# Patient Record
Sex: Male | Born: 1950
Health system: Southern US, Community
[De-identification: ages and names within clinical notes are randomized; demographics above are authoritative.]

## PROBLEM LIST (undated history)

## (undated) DIAGNOSIS — I2510 Atherosclerotic heart disease of native coronary artery without angina pectoris: Secondary | ICD-10-CM

## (undated) DIAGNOSIS — E785 Hyperlipidemia, unspecified: Secondary | ICD-10-CM

## (undated) DIAGNOSIS — I10 Essential (primary) hypertension: Secondary | ICD-10-CM

## (undated) DIAGNOSIS — I509 Heart failure, unspecified: Secondary | ICD-10-CM

## (undated) HISTORY — DX: Heart failure, unspecified: I50.9

## (undated) HISTORY — DX: Atherosclerotic heart disease of native coronary artery without angina pectoris: I25.10

## (undated) HISTORY — DX: Hyperlipidemia, unspecified: E78.5

## (undated) HISTORY — PX: OTHER SURGICAL HISTORY: SHX169

## (undated) HISTORY — DX: Essential (primary) hypertension: I10

---

## 2014-09-30 DIAGNOSIS — M25511 Pain in right shoulder: Secondary | ICD-10-CM | POA: Insufficient documentation

## 2016-01-19 ENCOUNTER — Other Ambulatory Visit (INDEPENDENT_AMBULATORY_CARE_PROVIDER_SITE_OTHER): Payer: BLUE CROSS/BLUE SHIELD

## 2016-01-19 ENCOUNTER — Ambulatory Visit (INDEPENDENT_AMBULATORY_CARE_PROVIDER_SITE_OTHER): Payer: BLUE CROSS/BLUE SHIELD | Admitting: Internal Medicine

## 2016-01-19 ENCOUNTER — Encounter: Payer: Self-pay | Admitting: Internal Medicine

## 2016-01-19 VITALS — BP 128/80 | HR 59 | Temp 98.3°F | Ht 73.0 in | Wt 218.0 lb

## 2016-01-19 DIAGNOSIS — IMO0001 Reserved for inherently not codable concepts without codable children: Secondary | ICD-10-CM

## 2016-01-19 DIAGNOSIS — I10 Essential (primary) hypertension: Secondary | ICD-10-CM

## 2016-01-19 DIAGNOSIS — B9681 Helicobacter pylori [H. pylori] as the cause of diseases classified elsewhere: Secondary | ICD-10-CM | POA: Diagnosis not present

## 2016-01-19 DIAGNOSIS — K2970 Gastritis, unspecified, without bleeding: Secondary | ICD-10-CM

## 2016-01-19 DIAGNOSIS — K219 Gastro-esophageal reflux disease without esophagitis: Secondary | ICD-10-CM

## 2016-01-19 DIAGNOSIS — Z23 Encounter for immunization: Secondary | ICD-10-CM

## 2016-01-19 DIAGNOSIS — Z0000 Encounter for general adult medical examination without abnormal findings: Secondary | ICD-10-CM | POA: Insufficient documentation

## 2016-01-19 LAB — BASIC METABOLIC PANEL
BUN: 18 mg/dL (ref 6–23)
CO2: 29 mEq/L (ref 19–32)
Calcium: 10 mg/dL (ref 8.4–10.5)
Chloride: 103 mEq/L (ref 96–112)
Creatinine, Ser: 1.04 mg/dL (ref 0.40–1.50)
GFR: 75.77 mL/min (ref >60.00)
Glucose, Bld: 102 mg/dL — ABNORMAL HIGH (ref 70–99)
Potassium: 5.2 mEq/L — ABNORMAL HIGH (ref 3.5–5.1)
Sodium: 138 mEq/L (ref 135–145)

## 2016-01-19 LAB — URINALYSIS
Bilirubin Urine: NEGATIVE
Hgb urine dipstick: NEGATIVE
Ketones, ur: NEGATIVE
Leukocytes, UA: NEGATIVE
Nitrite: NEGATIVE
Specific Gravity, Urine: 1.015 (ref 1.000–1.030)
Total Protein, Urine: NEGATIVE
Urine Glucose: NEGATIVE
Urobilinogen, UA: 0.2 (ref 0.0–1.0)
pH: 6.5 (ref 5.0–8.0)

## 2016-01-19 LAB — PSA: PSA: 2.43 ng/mL (ref 0.10–4.00)

## 2016-01-19 LAB — LIPID PANEL
Cholesterol: 242 mg/dL — ABNORMAL HIGH (ref 0–200)
HDL: 48.1 mg/dL (ref >39.00)
LDL Cholesterol: 168 mg/dL — ABNORMAL HIGH (ref 0–99)
NonHDL: 193.8
Total CHOL/HDL Ratio: 5
Triglycerides: 128 mg/dL (ref 0.0–149.0)
VLDL: 25.6 mg/dL (ref 0.0–40.0)

## 2016-01-19 LAB — CBC WITH DIFFERENTIAL/PLATELET
Basophils Absolute: 0 10*3/uL (ref 0.0–0.1)
Basophils Relative: 0.3 % (ref 0.0–3.0)
Eosinophils Absolute: 0.1 10*3/uL (ref 0.0–0.7)
Eosinophils Relative: 1.2 % (ref 0.0–5.0)
HCT: 39.9 % (ref 39.0–52.0)
Hemoglobin: 13.6 g/dL (ref 13.0–17.0)
Lymphocytes Relative: 36.7 % (ref 12.0–46.0)
Lymphs Abs: 2.1 10*3/uL (ref 0.7–4.0)
MCHC: 34.2 g/dL (ref 30.0–36.0)
MCV: 87.2 fl (ref 78.0–100.0)
Monocytes Absolute: 0.4 10*3/uL (ref 0.1–1.0)
Monocytes Relative: 6.4 % (ref 3.0–12.0)
Neutro Abs: 3.2 10*3/uL (ref 1.4–7.7)
Neutrophils Relative %: 55.4 % (ref 43.0–77.0)
Platelets: 218 10*3/uL (ref 150.0–400.0)
RBC: 4.57 Mil/uL (ref 4.22–5.81)
RDW: 13.5 % (ref 11.5–15.5)
WBC: 5.7 10*3/uL (ref 4.0–10.5)

## 2016-01-19 LAB — H. PYLORI ANTIBODY, IGG: H Pylori IgG: POSITIVE — AB

## 2016-01-19 LAB — TSH: TSH: 1.3 u[IU]/mL (ref 0.35–4.50)

## 2016-01-19 MED ORDER — VITAMIN D 1000 UNITS PO TABS: 1000.0000 [IU] | ORAL_TABLET | Freq: Every day | ORAL | 3 refills | Status: AC

## 2016-01-19 MED ORDER — LISINOPRIL 20 MG PO TABS: 20.0000 mg | ORAL_TABLET | Freq: Every day | ORAL | 3 refills | Status: DC

## 2016-01-19 MED ORDER — OMEPRAZOLE 20 MG PO CPDR: 20.0000 mg | DELAYED_RELEASE_CAPSULE | Freq: Every day | ORAL | 3 refills | Status: DC

## 2016-01-19 NOTE — Assessment & Plan Note (Signed)
Omeprazole qd Labs

## 2016-01-19 NOTE — Progress Notes (Signed)
Pre visit review using our clinic review tool, if applicable. No additional management support is needed unless otherwise documented below in the visit note. 

## 2016-01-19 NOTE — Assessment & Plan Note (Signed)
We discussed age appropriate health related issues, including available/recomended screening tests and vaccinations. We discussed a need for adhering to healthy diet and exercise. Labs/EKG were reviewed/ordered. All questions were answered.   

## 2016-01-19 NOTE — Assessment & Plan Note (Signed)
Lisinopril 

## 2016-01-20 LAB — HEPATITIS C ANTIBODY: HCV Ab: NEGATIVE

## 2016-01-23 ENCOUNTER — Other Ambulatory Visit: Payer: Self-pay | Admitting: Internal Medicine

## 2016-01-23 MED ORDER — AMOXICILL-CLARITHRO-LANSOPRAZ PO MISC: Freq: Two times a day (BID) | ORAL | 0 refills | Status: DC

## 2016-01-25 DIAGNOSIS — K2970 Gastritis, unspecified, without bleeding: Secondary | ICD-10-CM

## 2016-01-25 DIAGNOSIS — B9681 Helicobacter pylori [H. pylori] as the cause of diseases classified elsewhere: Secondary | ICD-10-CM | POA: Insufficient documentation

## 2016-01-25 NOTE — Assessment & Plan Note (Signed)
Prevpac

## 2016-01-25 NOTE — Progress Notes (Signed)
Subjective:  Patient ID: Alexander Hughes, male    DOB: [birth year]  Age: 65 y.o. MRN: [id number]  CC: Establish Care   HPI Alexander Hughes presents w/a complaint of GERD sx's - chronic and HTN - chronic  No outpatient prescriptions prior to visit.   No facility-administered medications prior to visit.     ROS Review of Systems  Constitutional: Negative for appetite change, fatigue and unexpected weight change.  HENT: Negative for congestion, nosebleeds, sneezing, sore throat and trouble swallowing.   Eyes: Negative for itching and visual disturbance.  Respiratory: Negative for cough.   Cardiovascular: Negative for chest pain, palpitations and leg swelling.  Gastrointestinal: Negative for abdominal distention, blood in stool, diarrhea and nausea.  Genitourinary: Negative for frequency and hematuria.  Musculoskeletal: Negative for back pain, gait problem, joint swelling and neck pain.  Skin: Negative for rash.  Neurological: Negative for dizziness, tremors, speech difficulty and weakness.  Psychiatric/Behavioral: Negative for agitation, dysphoric mood and sleep disturbance. The patient is not nervous/anxious.   GERD  Objective:  BP 128/80   Pulse (!) 59   Temp 98.3 F (36.8 C) (Oral)   Ht 6\' 1"  (1.854 m)   Wt 218 lb (98.9 kg)   SpO2 97%   BMI 28.76 kg/m   BP Readings from Last 3 Encounters:  01/19/16 128/80    Wt Readings from Last 3 Encounters:  01/19/16 218 lb (98.9 kg)    Physical Exam  Constitutional: He is oriented to person, place, and time. He appears well-developed. No distress.  NAD  HENT:  Mouth/Throat: Oropharynx is clear and moist.  Eyes: Conjunctivae are normal. Pupils are equal, round, and reactive to light.  Neck: Normal range of motion. No JVD present. No thyromegaly present.  Cardiovascular: Normal rate, regular rhythm, normal heart sounds and intact distal pulses.  Exam reveals no gallop and no friction rub.   No murmur  heard. Pulmonary/Chest: Effort normal and breath sounds normal. No respiratory distress. He has no wheezes. He has no rales. He exhibits no tenderness.  Abdominal: Soft. Bowel sounds are normal. He exhibits no distension and no mass. There is no tenderness. There is no rebound and no guarding.  Musculoskeletal: Normal range of motion. He exhibits no edema or tenderness.  Lymphadenopathy:    He has no cervical adenopathy.  Neurological: He is alert and oriented to person, place, and time. He has normal reflexes. No cranial nerve deficit. He exhibits normal muscle tone. He displays a negative Romberg sign. Coordination and gait normal.  Skin: Skin is warm and dry. No rash noted.  Psychiatric: He has a normal mood and affect. His behavior is normal. Judgment and thought content normal.    Lab Results  Component Value Date   WBC 5.7 01/19/2016   HGB 13.6 01/19/2016   HCT 39.9 01/19/2016   PLT 218.0 01/19/2016   GLUCOSE 102 (H) 01/19/2016   CHOL 242 (H) 01/19/2016   TRIG 128.0 01/19/2016   HDL 48.10 01/19/2016   LDLCALC 168 (H) 01/19/2016   NA 138 01/19/2016   K 5.2 (H) 01/19/2016   CL 103 01/19/2016   CREATININE 1.04 01/19/2016   BUN 18 01/19/2016   CO2 29 01/19/2016   TSH 1.30 01/19/2016   PSA 2.43 01/19/2016    Patient was never admitted.  Assessment & Plan:   Alexander Hughes was seen today for establish care.  Diagnoses and all orders for this visit:  Well adult -     Basic metabolic panel; Future -  CBC with Differential/Platelet; Future -     Hepatitis C antibody; Future -     Lipid panel; Future -     PSA; Future -     TSH; Future -     Urinalysis; Future -     H. pylori antibody, IgG; Future  Gastroesophageal reflux disease, esophagitis presence not specified -     H. pylori antibody, IgG; Future  Essential hypertension  Need for prophylactic vaccination against Streptococcus pneumoniae (pneumococcus) -     Pneumococcal polysaccharide vaccine 23-valent greater  than or equal to 2yo subcutaneous/IM  Other orders -     lisinopril (PRINIVIL,ZESTRIL) 20 MG tablet; Take 1 tablet (20 mg total) by mouth daily. -     omeprazole (PRILOSEC) 20 MG capsule; Take 1 capsule (20 mg total) by mouth daily. -     cholecalciferol (VITAMIN D) 1000 units tablet; Take 1 tablet (1,000 Units total) by mouth daily.   I am having Alexander Hughes start on lisinopril, omeprazole, and cholecalciferol.  Meds ordered this encounter  Medications  . lisinopril (PRINIVIL,ZESTRIL) 20 MG tablet    Sig: Take 1 tablet (20 mg total) by mouth daily.    Dispense:  90 tablet    Refill:  3  . omeprazole (PRILOSEC) 20 MG capsule    Sig: Take 1 capsule (20 mg total) by mouth daily.    Dispense:  30 capsule    Refill:  3  . cholecalciferol (VITAMIN D) 1000 units tablet    Sig: Take 1 tablet (1,000 Units total) by mouth daily.    Dispense:  100 tablet    Refill:  3     Follow-up: Return in about 6 months (around 07/21/2016) for a follow-up visit.  Sonda PrimesAlex Plotnikov, MD

## 2016-03-15 ENCOUNTER — Encounter: Payer: Self-pay | Admitting: Internal Medicine

## 2016-03-15 ENCOUNTER — Ambulatory Visit (INDEPENDENT_AMBULATORY_CARE_PROVIDER_SITE_OTHER): Payer: BLUE CROSS/BLUE SHIELD | Admitting: Internal Medicine

## 2016-03-15 DIAGNOSIS — B9681 Helicobacter pylori [H. pylori] as the cause of diseases classified elsewhere: Secondary | ICD-10-CM | POA: Diagnosis not present

## 2016-03-15 DIAGNOSIS — M19029 Primary osteoarthritis, unspecified elbow: Secondary | ICD-10-CM | POA: Insufficient documentation

## 2016-03-15 DIAGNOSIS — K2970 Gastritis, unspecified, without bleeding: Secondary | ICD-10-CM

## 2016-03-15 MED ORDER — MELOXICAM 15 MG PO TABS: 15.0000 mg | ORAL_TABLET | Freq: Every day | ORAL | 1 refills | Status: DC

## 2016-03-15 NOTE — Progress Notes (Signed)
Pre visit review using our clinic review tool, if applicable. No additional management support is needed unless otherwise documented below in the visit note. 

## 2016-03-15 NOTE — Assessment & Plan Note (Signed)
R elbow pain x 2 years - worse w/activity. Lately - unable to extend the arm. In the past he had an X ray and had a PT a couple times - did not help... Ortho ref Meloxicam 15 mg po qd prn

## 2016-03-15 NOTE — Progress Notes (Signed)
 Subjective:  Patient ID: Alexander Hughes, male    DOB: 05/30/1951  Age: 64 y.o. MRN: [id number]  CC: Arm Pain (Right elbow worsening)   HPI Alexander Hughes presents for R elbow pain x 2 years - worse w/activity. Lately - unable to extend the arm. In the past he had an X ray and had a PT a couple times - did not help... F/u H pylori gastritis  Outpatient Medications Prior to Visit  Medication Sig Dispense Refill  . amoxicillin-clarithromycin-lansoprazole (PREVPAC) combo pack Take by mouth 2 (two) times daily. Follow package directions. Use for 2 weeks, then, re-start Omeprazole and continue daily for 4 weeks. 1 kit 0  . cholecalciferol (VITAMIN D) 1000 units tablet Take 1 tablet (1,000 Units total) by mouth daily. 100 tablet 3  . lisinopril (PRINIVIL,ZESTRIL) 20 MG tablet Take 1 tablet (20 mg total) by mouth daily. 90 tablet 3  . omeprazole (PRILOSEC) 20 MG capsule Take 1 capsule (20 mg total) by mouth daily. 30 capsule 3   No facility-administered medications prior to visit.     ROS Review of Systems  Constitutional: Negative for appetite change, fatigue and unexpected weight change.  HENT: Negative for congestion, nosebleeds, sneezing, sore throat and trouble swallowing.   Eyes: Negative for itching and visual disturbance.  Respiratory: Negative for cough.   Cardiovascular: Negative for chest pain, palpitations and leg swelling.  Gastrointestinal: Negative for abdominal distention, blood in stool, diarrhea and nausea.  Genitourinary: Negative for frequency and hematuria.  Musculoskeletal: Positive for arthralgias. Negative for back pain, gait problem, joint swelling and neck pain.  Skin: Negative for rash.  Neurological: Negative for dizziness, tremors, speech difficulty and weakness.  Psychiatric/Behavioral: Negative for agitation, dysphoric mood and sleep disturbance. The patient is not nervous/anxious.     Objective:  BP 122/74   Pulse (!) 59   Wt 218 lb (98.9 kg)    SpO2 96%   BMI 28.76 kg/m   BP Readings from Last 3 Encounters:  03/15/16 122/74  01/19/16 128/80    Wt Readings from Last 3 Encounters:  03/15/16 218 lb (98.9 kg)  01/19/16 218 lb (98.9 kg)    Physical Exam  Constitutional: He is oriented to person, place, and time. He appears well-developed. No distress.  NAD  HENT:  Mouth/Throat: Oropharynx is clear and moist.  Eyes: Conjunctivae are normal. Pupils are equal, round, and reactive to light.  Neck: Normal range of motion. No JVD present. No thyromegaly present.  Cardiovascular: Normal rate, regular rhythm, normal heart sounds and intact distal pulses.  Exam reveals no gallop and no friction rub.   No murmur heard. Pulmonary/Chest: Effort normal and breath sounds normal. No respiratory distress. He has no wheezes. He has no rales. He exhibits no tenderness.  Abdominal: Soft. Bowel sounds are normal. He exhibits no distension and no mass. There is no tenderness. There is no rebound and no guarding.  Musculoskeletal: Normal range of motion. He exhibits tenderness. He exhibits no edema.  Lymphadenopathy:    He has no cervical adenopathy.  Neurological: He is alert and oriented to person, place, and time. He has normal reflexes. No cranial nerve deficit. He exhibits normal muscle tone. He displays a negative Romberg sign. Coordination and gait normal.  Skin: Skin is warm and dry. No rash noted.  Psychiatric: He has a normal mood and affect. His behavior is normal. Judgment and thought content normal.   unable to extend the R arm completelensitive  Lab Results  Component Value Date     WBC 5.7 01/19/2016   HGB 13.6 01/19/2016   HCT 39.9 01/19/2016   PLT 218.0 01/19/2016   GLUCOSE 102 (H) 01/19/2016   CHOL 242 (H) 01/19/2016   TRIG 128.0 01/19/2016   HDL 48.10 01/19/2016   LDLCALC 168 (H) 01/19/2016   NA 138 01/19/2016   K 5.2 (H) 01/19/2016   CL 103 01/19/2016   CREATININE 1.04 01/19/2016   BUN 18 01/19/2016   CO2 29  01/19/2016   TSH 1.30 01/19/2016   PSA 2.43 01/19/2016    Patient was never admitted.  Assessment & Plan:   There are no diagnoses linked to this encounter. I am having Mr. Orndoff maintain his lisinopril, omeprazole, cholecalciferol, and amoxicillin-clarithromycin-lansoprazole.  No orders of the defined types were placed in this encounter.    Follow-up: No Follow-up on file.  Alex , MD 

## 2016-03-15 NOTE — Assessment & Plan Note (Signed)
Finished Rx 

## 2017-02-16 ENCOUNTER — Encounter: Payer: Self-pay | Admitting: Internal Medicine

## 2017-02-16 ENCOUNTER — Ambulatory Visit (INDEPENDENT_AMBULATORY_CARE_PROVIDER_SITE_OTHER): Payer: Medicare Other | Admitting: Internal Medicine

## 2017-02-16 VITALS — BP 126/78 | HR 92 | Temp 98.6°F | Ht 73.0 in | Wt 229.0 lb

## 2017-02-16 DIAGNOSIS — Z0000 Encounter for general adult medical examination without abnormal findings: Secondary | ICD-10-CM

## 2017-02-16 DIAGNOSIS — N320 Bladder-neck obstruction: Secondary | ICD-10-CM

## 2017-02-16 DIAGNOSIS — K2970 Gastritis, unspecified, without bleeding: Secondary | ICD-10-CM | POA: Diagnosis not present

## 2017-02-16 DIAGNOSIS — K219 Gastro-esophageal reflux disease without esophagitis: Secondary | ICD-10-CM

## 2017-02-16 DIAGNOSIS — B9681 Helicobacter pylori [H. pylori] as the cause of diseases classified elsewhere: Secondary | ICD-10-CM

## 2017-02-16 DIAGNOSIS — I10 Essential (primary) hypertension: Secondary | ICD-10-CM

## 2017-02-16 DIAGNOSIS — E785 Hyperlipidemia, unspecified: Secondary | ICD-10-CM

## 2017-02-16 MED ORDER — VITAMIN D3 50 MCG (2000 UT) PO CAPS: 2000.0000 [IU] | ORAL_CAPSULE | Freq: Every day | ORAL | 3 refills | Status: DC

## 2017-02-16 MED ORDER — LISINOPRIL 20 MG PO TABS: 20.0000 mg | ORAL_TABLET | Freq: Every day | ORAL | 3 refills | Status: DC

## 2017-02-16 MED ORDER — ZOSTER VAC RECOMB ADJUVANTED 50 MCG/0.5ML IM SUSR: 0.5000 mL | Freq: Once | INTRAMUSCULAR | 1 refills | Status: AC

## 2017-02-16 NOTE — Assessment & Plan Note (Addendum)
Here for medicare wellness/physical  Diet: heart healthy  Physical activity: not sedentary  Depression/mood screen: negative  Hearing: intact to whispered voice  Visual acuity: grossly normal w/glasses, performs annual eye exam  ADLs: capable  Fall risk: low to none  Home safety: good  Cognitive evaluation: intact to orientation, naming, recall and repetition  EOL planning: adv directives, full code/ I agree  I have personally reviewed and have noted  1. The patient's medical, surgical and social history  2. Their use of alcohol, tobacco or illicit drugs  3. Their current medications and supplements  4. The patient's functional ability including ADL's, fall risks, home safety risks and hearing or visual impairment.  5. Diet and physical activities  6. Evidence for depression or mood disorders 7. The roster of all physicians providing medical care to patient - is listed in the Snapshot section of the chart and reviewed today.    Today patient counseled on age appropriate routine health concerns for screening and prevention, each reviewed and up to date or declined. Immunizations reviewed and up to date or declined. Labs ordered and reviewed. Risk factors for depression reviewed and negative. Hearing function and visual acuity are intact. ADLs screened and addressed as needed. Functional ability and level of safety reviewed and appropriate. Education, counseling and referrals performed based on assessed risks today. Patient provided with a copy of personalized plan for preventive services.   Declined flu shot Shingrix discussed Cologuard done

## 2017-02-16 NOTE — Assessment & Plan Note (Signed)
Re-start Lisinopril NAS diet Loose 10 lbs

## 2017-02-16 NOTE — Assessment & Plan Note (Signed)
Treated in 2017

## 2017-02-16 NOTE — Progress Notes (Signed)
Subjective:  Patient ID: Alexander Hughes, male    DOB: [birth year]  Age: 66 y.o. MRN: [id number]  CC: No chief complaint on file.   HPI Alexander Hughes presents for [facility] well F/u HTN - not taking BP meds regular, OA f/u  Outpatient Medications Prior to Visit  Medication Sig Dispense Refill  . lisinopril (PRINIVIL,ZESTRIL) 20 MG tablet Take 1 tablet (20 mg total) by mouth daily. 90 tablet 3  . meloxicam (MOBIC) 15 MG tablet Take 1 tablet (15 mg total) by mouth daily. 30 tablet 1   No facility-administered medications prior to visit.     ROS Review of Systems  Constitutional: Negative for appetite change, fatigue and unexpected weight change.  HENT: Negative for congestion, nosebleeds, sneezing, sore throat and trouble swallowing.   Eyes: Negative for itching and visual disturbance.  Respiratory: Negative for cough.   Cardiovascular: Negative for chest pain, palpitations and leg swelling.  Gastrointestinal: Negative for abdominal distention, blood in stool, diarrhea and nausea.  Genitourinary: Negative for frequency and hematuria.  Musculoskeletal: Positive for arthralgias. Negative for back pain, gait problem, joint swelling and neck pain.  Skin: Negative for rash.  Neurological: Negative for dizziness, tremors, speech difficulty and weakness.  Psychiatric/Behavioral: Negative for agitation, dysphoric mood and sleep disturbance. The patient is not nervous/anxious.     Objective:  BP 126/78 (BP Location: Left Arm, Patient Position: Sitting, Cuff Size: Large)   Pulse 92   Temp 98.6 F (37 C) (Oral)   Ht  (1.854 m)   Wt 229 lb (103.9 kg)   SpO2 94%   BMI 30.21 kg/m   BP Readings from Last 3 Encounters:  02/16/17 126/78  03/15/16 122/74  01/19/16 128/80    Wt Readings from Last 3 Encounters:  02/16/17 229 lb (103.9 kg)  03/15/16 218 lb (98.9 kg)  01/19/16 218 lb (98.9 kg)    Physical Exam  Constitutional: He is oriented to person, place, and time. He  appears well-developed. No distress.  NAD  HENT:  Mouth/Throat: Oropharynx is clear and moist.  Eyes: Pupils are equal, round, and reactive to light. Conjunctivae are normal.  Neck: Normal range of motion. No JVD present. No thyromegaly present.  Cardiovascular: Normal rate, regular rhythm, normal heart sounds and intact distal pulses.  Exam reveals no gallop and no friction rub.   No murmur heard. Pulmonary/Chest: Effort normal and breath sounds normal. No respiratory distress. He has no wheezes. He has no rales. He exhibits no tenderness.  Abdominal: Soft. Bowel sounds are normal. He exhibits no distension and no mass. There is no tenderness. There is no rebound and no guarding.  Musculoskeletal: Normal range of motion. He exhibits tenderness. He exhibits no edema.  Lymphadenopathy:    He has no cervical adenopathy.  Neurological: He is alert and oriented to person, place, and time. He has normal reflexes. No cranial nerve deficit. He exhibits normal muscle tone. He displays a negative Romberg sign. Coordination and gait normal.  Skin: Skin is warm and dry. No rash noted.  Psychiatric: He has a normal mood and affect. His behavior is normal. Judgment and thought content normal.  rectal - declined  Lab Results  Component Value Date   WBC 5.7 01/19/2016   HGB 13.6 01/19/2016   HCT 39.9 01/19/2016   PLT 218.0 01/19/2016   GLUCOSE 102 (H) 01/19/2016   CHOL 242 (H) 01/19/2016   TRIG 128.0 01/19/2016   HDL 48.10 01/19/2016   LDLCALC 168 (H) 01/19/2016   NA 138  01/19/2016   K 5.2 (H) 01/19/2016   CL 103 01/19/2016   CREATININE 1.04 01/19/2016   BUN 18 01/19/2016   CO2 29 01/19/2016   TSH 1.30 01/19/2016   PSA 2.43 01/19/2016    Patient was never admitted.  Assessment & Plan:   There are no diagnoses linked to this encounter. I am having Mr. Alexander Hughes maintain his lisinopril and meloxicam.  No orders of the defined types were placed in this encounter.    Follow-up: No  Follow-up on file.  Sonda PrimesAlex , MD

## 2017-02-16 NOTE — Assessment & Plan Note (Signed)
Resolved

## 2017-02-17 ENCOUNTER — Other Ambulatory Visit: Payer: Medicare Other

## 2017-02-17 ENCOUNTER — Other Ambulatory Visit (INDEPENDENT_AMBULATORY_CARE_PROVIDER_SITE_OTHER): Payer: Medicare Other

## 2017-02-17 DIAGNOSIS — I10 Essential (primary) hypertension: Secondary | ICD-10-CM | POA: Diagnosis not present

## 2017-02-17 DIAGNOSIS — N320 Bladder-neck obstruction: Secondary | ICD-10-CM

## 2017-02-17 DIAGNOSIS — E785 Hyperlipidemia, unspecified: Secondary | ICD-10-CM | POA: Diagnosis not present

## 2017-02-17 LAB — HEPATIC FUNCTION PANEL
ALT: 31 U/L (ref 0–53)
AST: 21 U/L (ref 0–37)
Albumin: 4.3 g/dL (ref 3.5–5.2)
Alkaline Phosphatase: 79 U/L (ref 39–117)
Bilirubin, Direct: 0.1 mg/dL (ref 0.0–0.3)
Total Bilirubin: 0.8 mg/dL (ref 0.2–1.2)
Total Protein: 6.7 g/dL (ref 6.0–8.3)

## 2017-02-17 LAB — BASIC METABOLIC PANEL
BUN: 23 mg/dL (ref 6–23)
CO2: 27 mEq/L (ref 19–32)
Calcium: 9.4 mg/dL (ref 8.4–10.5)
Chloride: 104 mEq/L (ref 96–112)
Creatinine, Ser: 1.1 mg/dL (ref 0.40–1.50)
GFR: 70.79 mL/min (ref >60.00)
Glucose, Bld: 92 mg/dL (ref 70–99)
Potassium: 4.7 mEq/L (ref 3.5–5.1)
Sodium: 138 mEq/L (ref 135–145)

## 2017-02-17 LAB — URINALYSIS
Bilirubin Urine: NEGATIVE
Hgb urine dipstick: NEGATIVE
Ketones, ur: NEGATIVE
Leukocytes, UA: NEGATIVE
Nitrite: NEGATIVE
Specific Gravity, Urine: 1.015 (ref 1.000–1.030)
Total Protein, Urine: NEGATIVE
Urine Glucose: NEGATIVE
Urobilinogen, UA: 0.2 (ref 0.0–1.0)
pH: 6.5 (ref 5.0–8.0)

## 2017-02-17 LAB — LIPID PANEL
Cholesterol: 224 mg/dL — ABNORMAL HIGH (ref 0–200)
HDL: 53.8 mg/dL (ref >39.00)
LDL Cholesterol: 142 mg/dL — ABNORMAL HIGH (ref 0–99)
NonHDL: 169.92
Total CHOL/HDL Ratio: 4
Triglycerides: 139 mg/dL (ref 0.0–149.0)
VLDL: 27.8 mg/dL (ref 0.0–40.0)

## 2017-02-17 LAB — TSH: TSH: 0.98 u[IU]/mL (ref 0.35–4.50)

## 2017-02-17 LAB — PSA: PSA: 2.91 ng/mL (ref 0.10–4.00)

## 2017-03-16 DIAGNOSIS — H11041 Peripheral pterygium, stationary, right eye: Secondary | ICD-10-CM | POA: Diagnosis not present

## 2017-03-16 DIAGNOSIS — H11431 Conjunctival hyperemia, right eye: Secondary | ICD-10-CM | POA: Diagnosis not present

## 2017-03-31 DIAGNOSIS — H11041 Peripheral pterygium, stationary, right eye: Secondary | ICD-10-CM | POA: Diagnosis not present

## 2017-07-11 DIAGNOSIS — J029 Acute pharyngitis, unspecified: Secondary | ICD-10-CM | POA: Diagnosis not present

## 2017-07-12 ENCOUNTER — Ambulatory Visit: Payer: Medicare Other | Admitting: Internal Medicine

## 2017-07-12 ENCOUNTER — Telehealth: Payer: Self-pay | Admitting: Internal Medicine

## 2017-07-12 MED ORDER — OMEPRAZOLE 40 MG PO CPDR: 40.0000 mg | DELAYED_RELEASE_CAPSULE | Freq: Every day | ORAL | 3 refills | Status: DC

## 2017-07-12 NOTE — Telephone Encounter (Signed)
Needs a refill

## 2017-07-16 DIAGNOSIS — R05 Cough: Secondary | ICD-10-CM | POA: Diagnosis not present

## 2018-06-05 ENCOUNTER — Other Ambulatory Visit: Payer: Self-pay

## 2018-06-05 ENCOUNTER — Telehealth: Payer: Self-pay | Admitting: Internal Medicine

## 2018-06-05 MED ORDER — OMEPRAZOLE 40 MG PO CPDR: 40.0000 mg | DELAYED_RELEASE_CAPSULE | Freq: Every day | ORAL | 0 refills | Status: DC

## 2018-06-05 MED ORDER — LISINOPRIL 20 MG PO TABS: 20.0000 mg | ORAL_TABLET | Freq: Every day | ORAL | 0 refills | Status: DC

## 2018-06-05 NOTE — Telephone Encounter (Signed)
Refills sent for 90 day supply only---patient needs office visit for future refills

## 2018-06-05 NOTE — Telephone Encounter (Signed)
Patient's wife came by the office requesting a refill on the following medications to [facility]: lisinopril (PRINIVIL,ZESTRIL) 20 MG tablet  omeprazole (PRILOSEC) 40 MG capsule

## 2018-08-24 ENCOUNTER — Inpatient Hospital Stay (HOSPITAL_COMMUNITY)
Admission: EM | Admit: 2018-08-24 | Discharge: 2018-08-26 | DRG: 247 | Disposition: A | Discharge status: Home / Self Care | Payer: Medicare HMO | Attending: Family Medicine | Admitting: Family Medicine

## 2018-08-24 ENCOUNTER — Other Ambulatory Visit: Payer: Self-pay

## 2018-08-24 ENCOUNTER — Emergency Department (HOSPITAL_COMMUNITY): Payer: Medicare HMO

## 2018-08-24 ENCOUNTER — Encounter (HOSPITAL_COMMUNITY): Payer: Self-pay | Admitting: *Deleted

## 2018-08-24 DIAGNOSIS — R945 Abnormal results of liver function studies: Secondary | ICD-10-CM | POA: Diagnosis present

## 2018-08-24 DIAGNOSIS — R918 Other nonspecific abnormal finding of lung field: Secondary | ICD-10-CM | POA: Diagnosis present

## 2018-08-24 DIAGNOSIS — Z8679 Personal history of other diseases of the circulatory system: Secondary | ICD-10-CM

## 2018-08-24 DIAGNOSIS — Z23 Encounter for immunization: Secondary | ICD-10-CM

## 2018-08-24 DIAGNOSIS — I159 Secondary hypertension, unspecified: Secondary | ICD-10-CM | POA: Diagnosis not present

## 2018-08-24 DIAGNOSIS — Z8249 Family history of ischemic heart disease and other diseases of the circulatory system: Secondary | ICD-10-CM

## 2018-08-24 DIAGNOSIS — E785 Hyperlipidemia, unspecified: Secondary | ICD-10-CM | POA: Diagnosis present

## 2018-08-24 DIAGNOSIS — D649 Anemia, unspecified: Secondary | ICD-10-CM | POA: Diagnosis present

## 2018-08-24 DIAGNOSIS — I361 Nonrheumatic tricuspid (valve) insufficiency: Secondary | ICD-10-CM | POA: Diagnosis not present

## 2018-08-24 DIAGNOSIS — R001 Bradycardia, unspecified: Secondary | ICD-10-CM | POA: Diagnosis present

## 2018-08-24 DIAGNOSIS — Z9114 Patient's other noncompliance with medication regimen: Secondary | ICD-10-CM | POA: Diagnosis not present

## 2018-08-24 DIAGNOSIS — I351 Nonrheumatic aortic (valve) insufficiency: Secondary | ICD-10-CM | POA: Diagnosis not present

## 2018-08-24 DIAGNOSIS — I25110 Atherosclerotic heart disease of native coronary artery with unstable angina pectoris: Secondary | ICD-10-CM | POA: Diagnosis present

## 2018-08-24 DIAGNOSIS — I340 Nonrheumatic mitral (valve) insufficiency: Secondary | ICD-10-CM | POA: Diagnosis not present

## 2018-08-24 DIAGNOSIS — I200 Unstable angina: Secondary | ICD-10-CM | POA: Diagnosis not present

## 2018-08-24 DIAGNOSIS — I10 Essential (primary) hypertension: Secondary | ICD-10-CM | POA: Diagnosis present

## 2018-08-24 DIAGNOSIS — I77810 Thoracic aortic ectasia: Secondary | ICD-10-CM | POA: Diagnosis present

## 2018-08-24 DIAGNOSIS — I214 Non-ST elevation (NSTEMI) myocardial infarction: Principal | ICD-10-CM | POA: Diagnosis present

## 2018-08-24 DIAGNOSIS — K219 Gastro-esophageal reflux disease without esophagitis: Secondary | ICD-10-CM | POA: Diagnosis present

## 2018-08-24 DIAGNOSIS — J9811 Atelectasis: Secondary | ICD-10-CM | POA: Diagnosis not present

## 2018-08-24 LAB — I-STAT TROPONIN, ED: Troponin i, poc: 0.04 ng/mL (ref 0.00–0.08)

## 2018-08-24 LAB — BASIC METABOLIC PANEL
Anion gap: 8 (ref 5–15)
BUN: 25 mg/dL — ABNORMAL HIGH (ref 8–23)
CO2: 24 mmol/L (ref 22–32)
Calcium: 8.7 mg/dL — ABNORMAL LOW (ref 8.9–10.3)
Chloride: 106 mmol/L (ref 98–111)
Creatinine, Ser: 1.22 mg/dL (ref 0.61–1.24)
GFR calc Af Amer: >60 mL/min (ref >60)
GFR calc non Af Amer: >60 mL/min (ref >60)
Glucose, Bld: 126 mg/dL — ABNORMAL HIGH (ref 70–99)
Potassium: 3.8 mmol/L (ref 3.5–5.1)
Sodium: 138 mmol/L (ref 135–145)

## 2018-08-24 LAB — CBC
HCT: 38.5 % — ABNORMAL LOW (ref 39.0–52.0)
Hemoglobin: 12.8 g/dL — ABNORMAL LOW (ref 13.0–17.0)
MCH: 30.2 pg (ref 26.0–34.0)
MCHC: 33.2 g/dL (ref 30.0–36.0)
MCV: 90.8 fL (ref 80.0–100.0)
Platelets: 181 10*3/uL (ref 150–400)
RBC: 4.24 MIL/uL (ref 4.22–5.81)
RDW: 12.8 % (ref 11.5–15.5)
WBC: 6.4 10*3/uL (ref 4.0–10.5)
nRBC: 0 % (ref 0.0–0.2)

## 2018-08-24 LAB — D-DIMER, QUANTITATIVE: D-Dimer, Quant: 0.63 ug/mL-FEU — ABNORMAL HIGH (ref 0.00–0.50)

## 2018-08-24 LAB — TROPONIN I: Troponin I: 0.24 ng/mL (ref <0.03)

## 2018-08-24 MED ORDER — ASPIRIN 81 MG PO CHEW
324.0000 mg | CHEWABLE_TABLET | Freq: Once | ORAL | Status: AC
  Administered 2018-08-24: 324 mg via ORAL
  Filled 2018-08-24: qty 4

## 2018-08-24 MED ORDER — HEPARIN (PORCINE) 25000 UT/250ML-% IV SOLN
1300.0000 [IU]/h | INTRAVENOUS | Status: DC
  Administered 2018-08-25: 1300 [IU]/h via INTRAVENOUS
  Filled 2018-08-24: qty 250

## 2018-08-24 MED ORDER — MORPHINE SULFATE (PF) 4 MG/ML IV SOLN
4.0000 mg | Freq: Once | INTRAVENOUS | Status: AC
  Administered 2018-08-24: 4 mg via INTRAVENOUS
  Filled 2018-08-24: qty 1

## 2018-08-24 MED ORDER — SODIUM CHLORIDE 0.9% FLUSH
3.0000 mL | Freq: Once | INTRAVENOUS | Status: AC
  Administered 2018-08-24: 3 mL via INTRAVENOUS

## 2018-08-24 MED ORDER — ALUM & MAG HYDROXIDE-SIMETH 200-200-20 MG/5ML PO SUSP
30.0000 mL | Freq: Once | ORAL | Status: AC
  Administered 2018-08-24: 30 mL via ORAL
  Filled 2018-08-24: qty 30

## 2018-08-24 MED ORDER — IOHEXOL 350 MG/ML SOLN
100.0000 mL | Freq: Once | INTRAVENOUS | Status: AC | PRN
  Administered 2018-08-24: 100 mL via INTRAVENOUS

## 2018-08-24 MED ORDER — SODIUM CHLORIDE (PF) 0.9 % IJ SOLN
INTRAMUSCULAR | Status: AC
  Filled 2018-08-24: qty 50

## 2018-08-24 MED ORDER — HEPARIN BOLUS VIA INFUSION
4000.0000 [IU] | Freq: Once | INTRAVENOUS | Status: AC
  Administered 2018-08-25: 4000 [IU] via INTRAVENOUS
  Filled 2018-08-24: qty 4000

## 2018-08-24 MED ORDER — LIDOCAINE VISCOUS HCL 2 % MT SOLN
15.0000 mL | Freq: Once | OROMUCOSAL | Status: AC
  Administered 2018-08-24: 15 mL via ORAL
  Filled 2018-08-24: qty 15

## 2018-08-24 NOTE — ED Notes (Signed)
Pt daughter at beside.

## 2018-08-24 NOTE — ED Triage Notes (Signed)
Pt c/o chest pain that started @ 1844 Is centrally located.  Pt had weakness, vomited, diaphoretic & feels like pressure

## 2018-08-24 NOTE — ED Provider Notes (Signed)
[facility]-EMERGENCY DEPT Provider Note   CSN: [id number] Arrival date & time: 08/24/18  1939    History   Chief Complaint Chief Complaint  Patient presents with  . Chest Pain    HPI Raydel Latsko is a 68 y.o. male.     The history is provided by the patient and medical records. No language interpreter was used.  Chest Pain   Shamarion Crupi is a 68 y.o. male who presents to the Emergency Department complaining of chest pain. He presents to the emergency department accompanied by his daughter for evaluation of central chest pain that began about 630 this evening. Pain is described as burning and constant nature. It radiates to bilateral upper extremities. He has associated shortness of breath, nausea and diaphoresis. He denies any alleviating or worsening factors. Denies any fevers, cough, abdominal pain, vomiting, leg swelling or pain. No recent surgery or travel. He denies any tobacco, alcohol, drug use. No significant past family medical history. He denies any personal health history although on chart review he is on lisinopril for high blood pressure. Patient denies taking any medications. Past Medical History:  Diagnosis Date  . CHF (congestive heart failure) [facility])     Patient Active Problem List   Diagnosis Date Noted  . 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 Helicobacter positive gastritis 01/25/2016  . Well adult exam 01/19/2016  . GERD (gastroesophageal reflux disease) 01/19/2016  . HTN (hypertension) 01/19/2016    History reviewed. No pertinent surgical history.      Home Medications    Prior to Admission medications   Medication Sig Start Date End Date Taking? Authorizing Provider  Cholecalciferol (VITAMIN D3) 2000 units capsule Take 1 capsule (2,000 Units total) by mouth daily. Patient not taking: Reported on 08/24/2018 02/16/17   Plotnikov, Georgina Quint, MD  lisinopril (PRINIVIL,ZESTRIL) 20 MG tablet Take 1 tablet (20 mg total) by mouth  daily. --patient needs office visit with pcp for future refills Patient not taking: Reported on 08/24/2018 06/05/18   Plotnikov, Georgina Quint, MD  meloxicam (MOBIC) 15 MG tablet Take 1 tablet (15 mg total) by mouth daily. Patient not taking: Reported on 08/24/2018 03/15/16   Plotnikov, Georgina Quint, MD  omeprazole (PRILOSEC) 40 MG capsule Take 1 capsule (40 mg total) by mouth daily. --patient needs office visit with pcp for future refills Patient not taking: Reported on 08/24/2018 06/05/18   Plotnikov, Georgina Quint, MD    Family History Family History  Problem Relation Age of Onset  . Heart disease Brother        arrhythmia     Social History Social History   Tobacco Use  . Smoking status: Never Smoker  . Smokeless tobacco: Never Used  Substance Use Topics  . Alcohol use: Yes    Comment: socially  . Drug use: No     Allergies   Patient has no known allergies.   Review of Systems Review of Systems  Cardiovascular: Positive for chest pain.  All other systems reviewed and are negative.    Physical Exam Updated Vital Signs BP (!) 162/93   Pulse 62   Temp 98.6 F (37 C) (Oral)   Resp 15   Ht 6' (1.829 m)   Wt 99.8 kg   SpO2 97%   BMI 29.84 kg/m   Physical Exam Vitals signs and nursing note reviewed.  Constitutional:      Appearance: He is well-developed.  HENT:     Head: Normocephalic and atraumatic.  Cardiovascular:  Rate and Rhythm: Normal rate and regular rhythm.     Heart sounds: No murmur.  Pulmonary:     Effort: Pulmonary effort is normal. No respiratory distress.     Breath sounds: Normal breath sounds.  Abdominal:     Palpations: Abdomen is soft.     Tenderness: There is no abdominal tenderness. There is no guarding or rebound.  Musculoskeletal:        General: No swelling or tenderness.  Skin:    General: Skin is warm and dry.  Neurological:     Mental Status: He is alert and oriented to person, place, and time.  Psychiatric:        Mood and Affect:  Mood normal.        Behavior: Behavior normal.      ED Treatments / Results  Labs (all labs ordered are listed, but only abnormal results are displayed) Labs Reviewed  BASIC METABOLIC PANEL - Abnormal; Notable for the following components:      Result Value   Glucose, Bld 126 (*)    BUN 25 (*)    Calcium 8.7 (*)    All other components within normal limits  CBC - Abnormal; Notable for the following components:   Hemoglobin 12.8 (*)    HCT 38.5 (*)    All other components within normal limits  D-DIMER, QUANTITATIVE (NOT AT [facility]) - Abnormal; Notable for the following components:   D-Dimer, Quant 0.63 (*)    All other components within normal limits  TROPONIN I - Abnormal; Notable for the following components:   Troponin I 0.24 (*)    All other components within normal limits  PROTIME-INR  APTT  I-STAT TROPONIN, ED    EKG EKG Interpretation  Date/Time:  Thursday August 24 2018 20:03:32 EDT Ventricular Rate:  72 PR Interval:  180 QRS Duration: 92 QT Interval:  387 QTC Calculation: 424 R Axis:   19 Text Interpretation:  Sinus rhythm Abnormal R-wave progression, early transition Confirmed by Tilden Fossa [id number]) on 08/24/2018 8:15:49 PM   Radiology Dg Chest 2 View  Result Date: 08/24/2018 CLINICAL DATA:  Chest pain and diaphoresis EXAM: CHEST - 2 VIEW COMPARISON:  None. FINDINGS: There is atelectatic change in the left base. The lungs elsewhere are clear. The heart size and pulmonary vascularity are normal. No adenopathy. No pneumothorax. There is degenerative change in each shoulder. IMPRESSION: Left base atelectasis. No edema or consolidation. Heart size within normal limits. Electronically Signed   By: Bretta Bang III M.D.   On: 08/24/2018 21:01   Ct Angio Chest Pe W/cm &/or Wo Cm  Result Date: 08/24/2018 CLINICAL DATA:  Chest pain EXAM: CT ANGIOGRAPHY CHEST WITH CONTRAST TECHNIQUE: Multidetector CT imaging of the chest was performed using the standard protocol  during bolus administration of intravenous contrast. Multiplanar CT image reconstructions and MIPs were obtained to evaluate the vascular anatomy. CONTRAST:  OMNIPAQUE IOHEXOL 350 MG/ML SOLN COMPARISON:  Chest x-ray earlier today FINDINGS: Cardiovascular: Heart is normal size. Aorta is normal caliber. No filling defects in the pulmonary arteries to suggest pulmonary emboli. Scattered coronary artery calcifications. Mediastinum/Nodes: No mediastinal, hilar, or axillary adenopathy. Lungs/Pleura: 5 mm right middle lobe nodule on image 88. Small right upper lobe nodules on image 73, the largest 5-6 mm. No confluent opacities or effusions. Upper Abdomen: Imaging into the upper abdomen shows no acute findings. Musculoskeletal: Chest wall soft tissues are unremarkable. No acute bony abnormality. Review of the MIP images confirms the above findings. IMPRESSION: No  evidence of pulmonary embolus. Scattered coronary artery calcifications. Small right pulmonary nodules, 5-6 mm. Non-contrast chest CT at 3-6 months is recommended. If the nodules are stable at time of repeat CT, then future CT at 18-24 months (from today's scan) is considered optional for low-risk patients, but is recommended for high-risk patients. This recommendation follows the consensus statement: Guidelines for Management of Incidental Pulmonary Nodules Detected on CT Images: From the Fleischner Society 2017; Radiology 2017; 284:228-243. Electronically Signed   By: Charlett Nose M.D.   On: 08/24/2018 22:50    Procedures Procedures (including critical care time) CRITICAL CARE Performed by: Tilden Fossa   Total critical care time: 35 minutes  Critical care time was exclusive of separately billable procedures and treating other patients.  Critical care was necessary to treat or prevent imminent or life-threatening deterioration.  Critical care was time spent personally by me on the following activities: development of treatment plan with  patient and/or surrogate as well as nursing, discussions with consultants, evaluation of patient's response to treatment, examination of patient, obtaining history from patient or surrogate, ordering and performing treatments and interventions, ordering and review of laboratory studies, ordering and review of radiographic studies, pulse oximetry and re-evaluation of patient's condition.  Medications Ordered in ED Medications  heparin bolus via infusion 4,000 Units (has no administration in time range)  heparin ADULT infusion 100 units/mL (25000 units/265mL sodium chloride 0.45%) (has no administration in time range)  sodium chloride flush (NS) 0.9 % injection 3 mL (3 mLs Intravenous Given 08/24/18 2320)  aspirin chewable tablet 324 mg (324 mg Oral Given 08/24/18 2101)  morphine 4 MG/ML injection 4 mg (4 mg Intravenous Given 08/24/18 2102)  iohexol (OMNIPAQUE) 350 MG/ML injection 100 mL (100 mLs Intravenous Contrast Given 08/24/18 2226)  alum & mag hydroxide-simeth (MAALOX/MYLANTA) 200-200-20 MG/5ML suspension 30 mL (30 mLs Oral Given 08/24/18 2319)    And  lidocaine (XYLOCAINE) 2 % viscous mouth solution 15 mL (15 mLs Oral Given 08/24/18 2319)     Initial Impression / Assessment and Plan / ED Course  I have reviewed the triage vital signs and the nursing notes.  Pertinent labs & imaging results that were available during my care of the patient were reviewed by me and considered in my medical decision making (see chart for details).        Patient here for evaluation of central chest pain that began around 630 today. EKG without acute ischemic changes. Troponin is negative. D dimer is mildly elevated and CTA was obtained. CT is negative for pulmonary embolism. Discussed with patient recommendation for admission for observation and patient is initially hesitant but does agree to repeat troponin. Repeat troponin is elevated concerning for non-ST elevation MI. Repeat EKG without ischemic changes.  Patient updated of findings of studies and recommendation for admission and he is in agreement with treatment plan. His pain is well-controlled. Medicine consulted for admission for further treatment.  Final Clinical Impressions(s) / ED Diagnoses   Final diagnoses:  NSTEMI (non-ST elevated myocardial infarction) [facility])    ED Discharge Orders    None       Tilden Fossa, MD 08/25/18 0004

## 2018-08-25 ENCOUNTER — Encounter (HOSPITAL_COMMUNITY)
Admission: EM | Disposition: A | Discharge status: Home / Self Care | Payer: Self-pay | Source: Home / Self Care | Attending: Internal Medicine

## 2018-08-25 ENCOUNTER — Observation Stay (HOSPITAL_BASED_OUTPATIENT_CLINIC_OR_DEPARTMENT_OTHER): Payer: Medicare HMO

## 2018-08-25 ENCOUNTER — Encounter (HOSPITAL_COMMUNITY): Payer: Self-pay | Admitting: Internal Medicine

## 2018-08-25 DIAGNOSIS — I159 Secondary hypertension, unspecified: Secondary | ICD-10-CM

## 2018-08-25 DIAGNOSIS — I214 Non-ST elevation (NSTEMI) myocardial infarction: Secondary | ICD-10-CM

## 2018-08-25 DIAGNOSIS — I200 Unstable angina: Secondary | ICD-10-CM | POA: Diagnosis present

## 2018-08-25 DIAGNOSIS — K219 Gastro-esophageal reflux disease without esophagitis: Secondary | ICD-10-CM | POA: Diagnosis present

## 2018-08-25 DIAGNOSIS — I77810 Thoracic aortic ectasia: Secondary | ICD-10-CM | POA: Diagnosis present

## 2018-08-25 DIAGNOSIS — I10 Essential (primary) hypertension: Secondary | ICD-10-CM

## 2018-08-25 DIAGNOSIS — I340 Nonrheumatic mitral (valve) insufficiency: Secondary | ICD-10-CM | POA: Diagnosis not present

## 2018-08-25 DIAGNOSIS — Z9114 Patient's other noncompliance with medication regimen: Secondary | ICD-10-CM | POA: Diagnosis not present

## 2018-08-25 DIAGNOSIS — Z8679 Personal history of other diseases of the circulatory system: Secondary | ICD-10-CM | POA: Diagnosis not present

## 2018-08-25 DIAGNOSIS — I25110 Atherosclerotic heart disease of native coronary artery with unstable angina pectoris: Secondary | ICD-10-CM | POA: Diagnosis present

## 2018-08-25 DIAGNOSIS — Z23 Encounter for immunization: Secondary | ICD-10-CM | POA: Diagnosis present

## 2018-08-25 DIAGNOSIS — E785 Hyperlipidemia, unspecified: Secondary | ICD-10-CM | POA: Diagnosis present

## 2018-08-25 DIAGNOSIS — R918 Other nonspecific abnormal finding of lung field: Secondary | ICD-10-CM | POA: Diagnosis present

## 2018-08-25 DIAGNOSIS — I351 Nonrheumatic aortic (valve) insufficiency: Secondary | ICD-10-CM | POA: Diagnosis not present

## 2018-08-25 DIAGNOSIS — Z8249 Family history of ischemic heart disease and other diseases of the circulatory system: Secondary | ICD-10-CM | POA: Diagnosis not present

## 2018-08-25 DIAGNOSIS — I361 Nonrheumatic tricuspid (valve) insufficiency: Secondary | ICD-10-CM | POA: Diagnosis not present

## 2018-08-25 DIAGNOSIS — D649 Anemia, unspecified: Secondary | ICD-10-CM | POA: Diagnosis present

## 2018-08-25 DIAGNOSIS — R945 Abnormal results of liver function studies: Secondary | ICD-10-CM | POA: Diagnosis present

## 2018-08-25 DIAGNOSIS — R001 Bradycardia, unspecified: Secondary | ICD-10-CM | POA: Diagnosis present

## 2018-08-25 HISTORY — PX: CORONARY STENT INTERVENTION: CATH118234

## 2018-08-25 HISTORY — DX: Unstable angina: I20.0

## 2018-08-25 HISTORY — DX: Non-ST elevation (NSTEMI) myocardial infarction: I21.4

## 2018-08-25 HISTORY — PX: LEFT HEART CATH AND CORONARY ANGIOGRAPHY: CATH118249

## 2018-08-25 HISTORY — PX: CORONARY THROMBECTOMY: CATH118304

## 2018-08-25 LAB — LIPID PANEL
Cholesterol: 205 mg/dL — ABNORMAL HIGH (ref 0–200)
HDL: 45 mg/dL (ref >40)
LDL Cholesterol: 140 mg/dL — ABNORMAL HIGH (ref 0–99)
Total CHOL/HDL Ratio: 4.6 RATIO
Triglycerides: 101 mg/dL (ref <150)
VLDL: 20 mg/dL (ref 0–40)

## 2018-08-25 LAB — POCT ACTIVATED CLOTTING TIME
Activated Clotting Time: 263 seconds
Activated Clotting Time: 466 seconds

## 2018-08-25 LAB — BASIC METABOLIC PANEL
Anion gap: 8 (ref 5–15)
BUN: 19 mg/dL (ref 8–23)
CO2: 23 mmol/L (ref 22–32)
Calcium: 8.5 mg/dL — ABNORMAL LOW (ref 8.9–10.3)
Chloride: 106 mmol/L (ref 98–111)
Creatinine, Ser: 0.99 mg/dL (ref 0.61–1.24)
GFR calc Af Amer: >60 mL/min (ref >60)
GFR calc non Af Amer: >60 mL/min (ref >60)
Glucose, Bld: 106 mg/dL — ABNORMAL HIGH (ref 70–99)
Potassium: 4 mmol/L (ref 3.5–5.1)
Sodium: 137 mmol/L (ref 135–145)

## 2018-08-25 LAB — PROTIME-INR
INR: 1 (ref 0.8–1.2)
Prothrombin Time: 13 seconds (ref 11.4–15.2)

## 2018-08-25 LAB — HEPATIC FUNCTION PANEL
ALT: 35 U/L (ref 0–44)
AST: 54 U/L — ABNORMAL HIGH (ref 15–41)
Albumin: 4 g/dL (ref 3.5–5.0)
Alkaline Phosphatase: 73 U/L (ref 38–126)
Bilirubin, Direct: 0.3 mg/dL — ABNORMAL HIGH (ref 0.0–0.2)
Indirect Bilirubin: 1.1 mg/dL — ABNORMAL HIGH (ref 0.3–0.9)
Total Bilirubin: 1.4 mg/dL — ABNORMAL HIGH (ref 0.3–1.2)
Total Protein: 6.7 g/dL (ref 6.5–8.1)

## 2018-08-25 LAB — HEPARIN LEVEL (UNFRACTIONATED): Heparin Unfractionated: 0.31 IU/mL (ref 0.30–0.70)

## 2018-08-25 LAB — ECHOCARDIOGRAM COMPLETE
Height: 73 in
Weight: 3657.87 oz

## 2018-08-25 LAB — HIV ANTIBODY (ROUTINE TESTING W REFLEX): HIV Screen 4th Generation wRfx: NONREACTIVE

## 2018-08-25 LAB — APTT: aPTT: 29 seconds (ref 24–36)

## 2018-08-25 LAB — LIPASE, BLOOD: Lipase: 59 U/L — ABNORMAL HIGH (ref 11–51)

## 2018-08-25 SURGERY — LEFT HEART CATH AND CORONARY ANGIOGRAPHY
Anesthesia: LOCAL

## 2018-08-25 MED ORDER — ATORVASTATIN CALCIUM 80 MG PO TABS
80.0000 mg | ORAL_TABLET | Freq: Every day | ORAL | Status: DC
  Administered 2018-08-25: 80 mg via ORAL
  Filled 2018-08-25: qty 1

## 2018-08-25 MED ORDER — ONDANSETRON HCL 4 MG/2ML IJ SOLN: 4.0000 mg | Freq: Four times a day (QID) | INTRAMUSCULAR | Status: DC | PRN

## 2018-08-25 MED ORDER — PANTOPRAZOLE SODIUM 40 MG PO TBEC: 40.0000 mg | DELAYED_RELEASE_TABLET | Freq: Every day | ORAL | 3 refills | Status: DC

## 2018-08-25 MED ORDER — SODIUM CHLORIDE 0.9 % IV SOLN: 250.0000 mL | INTRAVENOUS | Status: DC | PRN

## 2018-08-25 MED ORDER — VERAPAMIL HCL 2.5 MG/ML IV SOLN
INTRAVENOUS | Status: DC | PRN
  Administered 2018-08-25: 10 mL via INTRA_ARTERIAL

## 2018-08-25 MED ORDER — SODIUM CHLORIDE 0.9% FLUSH: 3.0000 mL | INTRAVENOUS | Status: DC | PRN

## 2018-08-25 MED ORDER — NITROGLYCERIN 1 MG/10 ML FOR IR/CATH LAB
INTRA_ARTERIAL | Status: DC | PRN
  Administered 2018-08-25 (×2): 200 ug via INTRACORONARY

## 2018-08-25 MED ORDER — ATORVASTATIN CALCIUM 10 MG PO TABS: 20.0000 mg | ORAL_TABLET | Freq: Every day | ORAL | Status: DC

## 2018-08-25 MED ORDER — LIDOCAINE HCL (PF) 1 % IJ SOLN
INTRAMUSCULAR | Status: AC
  Filled 2018-08-25: qty 30

## 2018-08-25 MED ORDER — ASPIRIN 81 MG PO CHEW: 81.0000 mg | CHEWABLE_TABLET | ORAL | Status: DC

## 2018-08-25 MED ORDER — AMLODIPINE BESYLATE 10 MG PO TABS
10.0000 mg | ORAL_TABLET | Freq: Every day | ORAL | Status: DC
  Administered 2018-08-25: 10 mg via ORAL
  Filled 2018-08-25: qty 1

## 2018-08-25 MED ORDER — MIDAZOLAM HCL 2 MG/2ML IJ SOLN
INTRAMUSCULAR | Status: AC
  Filled 2018-08-25: qty 2

## 2018-08-25 MED ORDER — HEPARIN (PORCINE) IN NACL 1000-0.9 UT/500ML-% IV SOLN
INTRAVENOUS | Status: AC
  Filled 2018-08-25: qty 1000

## 2018-08-25 MED ORDER — TICAGRELOR 90 MG PO TABS
90.0000 mg | ORAL_TABLET | Freq: Two times a day (BID) | ORAL | Status: DC
  Administered 2018-08-26 (×2): 90 mg via ORAL
  Filled 2018-08-25 (×2): qty 1

## 2018-08-25 MED ORDER — SODIUM CHLORIDE 0.9% FLUSH: 3.0000 mL | Freq: Two times a day (BID) | INTRAVENOUS | Status: DC

## 2018-08-25 MED ORDER — ANGIOPLASTY BOOK
Freq: Once | Status: AC
  Administered 2018-08-25: 23:00:00

## 2018-08-25 MED ORDER — LIDOCAINE HCL (PF) 1 % IJ SOLN
INTRAMUSCULAR | Status: DC | PRN
  Administered 2018-08-25: 2 mL via INTRADERMAL

## 2018-08-25 MED ORDER — LABETALOL HCL 5 MG/ML IV SOLN: 10.0000 mg | INTRAVENOUS | Status: AC | PRN

## 2018-08-25 MED ORDER — TICAGRELOR 90 MG PO TABS
ORAL_TABLET | ORAL | Status: AC
  Filled 2018-08-25: qty 2

## 2018-08-25 MED ORDER — TICAGRELOR 90 MG PO TABS
ORAL_TABLET | ORAL | Status: DC | PRN
  Administered 2018-08-25: 180 mg via ORAL

## 2018-08-25 MED ORDER — TICAGRELOR 90 MG PO TABS: 90.0000 mg | ORAL_TABLET | Freq: Two times a day (BID) | ORAL | 3 refills | Status: DC

## 2018-08-25 MED ORDER — SODIUM CHLORIDE 0.9% FLUSH
3.0000 mL | Freq: Two times a day (BID) | INTRAVENOUS | Status: DC
  Administered 2018-08-25: 22:00:00 3 mL via INTRAVENOUS

## 2018-08-25 MED ORDER — THE SENSUOUS HEART BOOK
Freq: Once | Status: AC
  Administered 2018-08-25: 23:00:00

## 2018-08-25 MED ORDER — HEPARIN (PORCINE) IN NACL 1000-0.9 UT/500ML-% IV SOLN
INTRAVENOUS | Status: DC | PRN
  Administered 2018-08-25 (×2): 500 mL

## 2018-08-25 MED ORDER — ASPIRIN EC 81 MG PO TBEC
81.0000 mg | DELAYED_RELEASE_TABLET | Freq: Every day | ORAL | Status: DC
  Administered 2018-08-25 – 2018-08-26 (×2): 81 mg via ORAL
  Filled 2018-08-25 (×2): qty 1

## 2018-08-25 MED ORDER — SODIUM CHLORIDE 0.9 % WEIGHT BASED INFUSION
1.0000 mL/kg/h | INTRAVENOUS | Status: DC
  Administered 2018-08-25: 1 mL/kg/h via INTRAVENOUS

## 2018-08-25 MED ORDER — MIDAZOLAM HCL 2 MG/2ML IJ SOLN
INTRAMUSCULAR | Status: DC | PRN
  Administered 2018-08-25: 1 mg via INTRAVENOUS

## 2018-08-25 MED ORDER — FENTANYL CITRATE (PF) 100 MCG/2ML IJ SOLN
INTRAMUSCULAR | Status: DC | PRN
  Administered 2018-08-25: 25 ug via INTRAVENOUS

## 2018-08-25 MED ORDER — TIROFIBAN (AGGRASTAT) BOLUS VIA INFUSION
INTRAVENOUS | Status: DC | PRN
  Administered 2018-08-25: 2592.5 ug via INTRAVENOUS

## 2018-08-25 MED ORDER — ASPIRIN 81 MG PO TBEC: 81.0000 mg | DELAYED_RELEASE_TABLET | Freq: Every day | ORAL | 3 refills | Status: DC

## 2018-08-25 MED ORDER — IOHEXOL 350 MG/ML SOLN
INTRAVENOUS | Status: DC | PRN
  Administered 2018-08-25: 215 mL via INTRA_ARTERIAL

## 2018-08-25 MED ORDER — SODIUM CHLORIDE 0.9 % WEIGHT BASED INFUSION
3.0000 mL/kg/h | INTRAVENOUS | Status: AC
  Administered 2018-08-25: 3 mL/kg/h via INTRAVENOUS

## 2018-08-25 MED ORDER — ACETAMINOPHEN 325 MG PO TABS: 650.0000 mg | ORAL_TABLET | ORAL | Status: DC | PRN

## 2018-08-25 MED ORDER — HEPARIN SODIUM (PORCINE) 1000 UNIT/ML IJ SOLN
INTRAMUSCULAR | Status: DC | PRN
  Administered 2018-08-25: 3000 [IU] via INTRAVENOUS
  Administered 2018-08-25 (×2): 6000 [IU] via INTRAVENOUS

## 2018-08-25 MED ORDER — PNEUMOCOCCAL VAC POLYVALENT 25 MCG/0.5ML IJ INJ
0.5000 mL | INJECTION | INTRAMUSCULAR | Status: AC
  Administered 2018-08-26: 0.5 mL via INTRAMUSCULAR
  Filled 2018-08-25: qty 0.5

## 2018-08-25 MED ORDER — ASPIRIN 81 MG PO CHEW: 81.0000 mg | CHEWABLE_TABLET | ORAL | Status: AC

## 2018-08-25 MED ORDER — NITROGLYCERIN IN D5W 200-5 MCG/ML-% IV SOLN
0.0000 ug/min | INTRAVENOUS | Status: DC
  Administered 2018-08-25: 5 ug/min via INTRAVENOUS
  Filled 2018-08-25: qty 250

## 2018-08-25 MED ORDER — VERAPAMIL HCL 2.5 MG/ML IV SOLN
INTRAVENOUS | Status: AC
  Filled 2018-08-25: qty 2

## 2018-08-25 MED ORDER — SODIUM CHLORIDE 0.9 % IV SOLN
INTRAVENOUS | Status: AC
  Administered 2018-08-25: 17:00:00 via INTRAVENOUS

## 2018-08-25 MED ORDER — ATORVASTATIN CALCIUM 20 MG PO TABS: 20.0000 mg | ORAL_TABLET | Freq: Every day | ORAL | 3 refills | Status: DC

## 2018-08-25 MED ORDER — HYDRALAZINE HCL 20 MG/ML IJ SOLN: 5.0000 mg | INTRAMUSCULAR | Status: AC | PRN

## 2018-08-25 MED ORDER — ONDANSETRON HCL 4 MG PO TABS: 4.0000 mg | ORAL_TABLET | Freq: Four times a day (QID) | ORAL | Status: DC | PRN

## 2018-08-25 MED ORDER — SODIUM CHLORIDE 0.9 % IV SOLN
INTRAVENOUS | Status: AC
  Administered 2018-08-25: 02:00:00 via INTRAVENOUS

## 2018-08-25 MED ORDER — HEPARIN SODIUM (PORCINE) 1000 UNIT/ML IJ SOLN
INTRAMUSCULAR | Status: AC
  Filled 2018-08-25: qty 1

## 2018-08-25 MED ORDER — HEART ATTACK BOUNCING BOOK
Freq: Once | Status: AC
  Administered 2018-08-25: 23:00:00

## 2018-08-25 MED ORDER — ACETAMINOPHEN 650 MG RE SUPP: 650.0000 mg | Freq: Four times a day (QID) | RECTAL | Status: DC | PRN

## 2018-08-25 MED ORDER — ACETAMINOPHEN 325 MG PO TABS: 650.0000 mg | ORAL_TABLET | Freq: Four times a day (QID) | ORAL | Status: DC | PRN

## 2018-08-25 MED ORDER — TIROFIBAN HCL IN NACL 5-0.9 MG/100ML-% IV SOLN
INTRAVENOUS | Status: AC
  Filled 2018-08-25: qty 100

## 2018-08-25 MED ORDER — MORPHINE SULFATE (PF) 2 MG/ML IV SOLN: 1.0000 mg | INTRAVENOUS | Status: DC | PRN

## 2018-08-25 MED ORDER — FENTANYL CITRATE (PF) 100 MCG/2ML IJ SOLN
INTRAMUSCULAR | Status: AC
  Filled 2018-08-25: qty 2

## 2018-08-25 MED FILL — ASPIRIN LOW DOSE 81 MG TBEC: 81 | 60 days supply | Qty: 60 | Fill #0 | Status: TO

## 2018-08-25 MED FILL — PANTOPRAZOLE SOD DR 40 MG T: 40 | 60 days supply | Qty: 60 | Fill #0 | Status: TO

## 2018-08-25 MED FILL — BRILINTA 90 MG TABLET: 90 | 30 days supply | Qty: 60 | Fill #0 | Status: TO

## 2018-08-25 MED FILL — ATORVASTATIN CALCIUM 20 MG: 20 | 60 days supply | Qty: 60 | Fill #0 | Status: TO

## 2018-08-25 SURGICAL SUPPLY — 20 items
BALLN EMERGE MR 2.5X15 (BALLOONS) ×2
BALLN SAPPHIRE 2.75X15 (BALLOONS) ×2
BALLN SAPPHIRE ~~LOC~~ 3.0X10 (BALLOONS) ×2 IMPLANT
BALLOON EMERGE MR 2.5X15 (BALLOONS) ×1 IMPLANT
BALLOON SAPPHIRE 2.75X15 (BALLOONS) ×1 IMPLANT
CATH EXTRAC PRONTO 5.5F 138CM (CATHETERS) ×2 IMPLANT
CATH INFINITI 5FR ANG PIGTAIL (CATHETERS) ×2 IMPLANT
CATH LAUNCHER 6FR EBU3.5 (CATHETERS) ×2 IMPLANT
CATH OPTITORQUE TIG 4.0 5F (CATHETERS) ×2 IMPLANT
DEVICE RAD COMP TR BAND LRG (VASCULAR PRODUCTS) ×2 IMPLANT
GLIDESHEATH SLEND A-KIT 6F 22G (SHEATH) ×2 IMPLANT
GUIDEWIRE INQWIRE 1.5J.035X260 (WIRE) ×1 IMPLANT
INQWIRE 1.5J .035X260CM (WIRE) ×2
KIT ENCORE 26 ADVANTAGE (KITS) ×2 IMPLANT
KIT HEART LEFT (KITS) ×2 IMPLANT
PACK CARDIAC CATHETERIZATION (CUSTOM PROCEDURE TRAY) ×2 IMPLANT
STENT SYNERGY DES 3X20 (Permanent Stent) ×2 IMPLANT
TRANSDUCER W/STOPCOCK (MISCELLANEOUS) ×2 IMPLANT
TUBING CIL FLEX 10 FLL-RA (TUBING) ×2 IMPLANT
WIRE MINAMO 190 (WIRE) ×2 IMPLANT

## 2018-08-25 NOTE — Progress Notes (Addendum)
[facility] PROGRESS NOTE  Kenyada Homeier ULG:[id number] DOB: 1950-12-29 DOA: 08/24/2018  PCP: Tresa Garter, MD  Brief History/Interval Summary: 68 year old male with a past medical history of essential hypertension who has not been taking his medications for 2 years presented with complaints of chest pain.  Patient symptoms were thought to be significant cardiac etiology.  He was hospitalized for further management.  Reason for Visit: Non-ST elevation MI  Consultants: Cardiology  Procedures: Transthoracic echocardiogram is pending  Antibiotics: None  Subjective/Interval History: Patient states that pain in the chest is 1 out of 10 in intensity.  Located in the central part of the chest. No shortness of breath.  Denies any nausea or vomiting.  No palpitations.  ROS: Denies any dizziness or lightheadedness  Objective:  Vital Signs  Vitals:   08/25/18 0400 08/25/18 0438 08/25/18 0449 08/25/18 0827  BP: (!) 158/87   (!) 149/81  Pulse: 64   (!) 54  Resp: (!) 21   19  Temp:   98.2 F (36.8 C) 98.2 F (36.8 C)  TempSrc:   Oral Oral  SpO2: 96%   97%  Weight:  103.7 kg    Height:  6\' 1"  (1.854 m)      Intake/Output Summary (Last 24 hours) at 08/25/2018 1009 Last data filed at 08/25/2018 0644 Gross per 24 hour  Intake 590 ml  Output -  Net 590 ml   Filed Weights   08/24/18 1954 08/24/18 2003 08/25/18 0438  Weight: 99.8 kg 99.8 kg 103.7 kg    General appearance: alert, cooperative, appears stated age and no distress Head: Normocephalic, without obvious abnormality, atraumatic Eyes: conjunctivae/corneas clear. PERRL, EOM's intact.  Resp: clear to auscultation bilaterally Cardio: regular rate and rhythm, S1, S2 normal, no murmur, click, rub or gallop GI: soft, non-tender; bowel sounds normal; no masses,  no organomegaly Extremities: extremities normal, atraumatic, no cyanosis or edema Pulses: 2+ and symmetric Neurologic: Alert and oriented x3.  No  focal neurological deficits.  Lab Results:  Data Reviewed: I have personally reviewed following labs and imaging studies  CBC: Recent Labs  Lab 08/24/18 2000  WBC 6.4  HGB 12.8*  HCT 38.5*  MCV 90.8  PLT 181    Basic Metabolic Panel: Recent Labs  Lab 08/24/18 2000 08/25/18 0804  NA 138 137  K 3.8 4.0  CL 106 106  CO2 24 23  GLUCOSE 126* 106*  BUN 25* 19  CREATININE 1.22 0.99  CALCIUM 8.7* 8.5*    GFR: Estimated Creatinine Clearance: 89 mL/min (by C-G formula based on SCr of 0.99 mg/dL).  Liver Function Tests: Recent Labs  Lab 08/25/18 0204  AST 54*  ALT 35  ALKPHOS 73  BILITOT 1.4*  PROT 6.7  ALBUMIN 4.0    Recent Labs  Lab 08/25/18 0204  LIPASE 59*    Coagulation Profile: Recent Labs  Lab 08/25/18 0029  INR 1.0    Cardiac Enzymes: Recent Labs  Lab 08/24/18 2300 08/25/18 0204  TROPONINI 0.24* 2.76*    Lipid Profile: Recent Labs    08/25/18 0804  CHOL 205*  HDL 45  LDLCALC 140*  TRIG 101  CHOLHDL 4.6      Radiology Studies: Dg Chest 2 View  Result Date: 08/24/2018 CLINICAL DATA:  Chest pain and diaphoresis EXAM: CHEST - 2 VIEW COMPARISON:  None. FINDINGS: There is atelectatic change in the left base. The lungs elsewhere are clear. The heart size and pulmonary vascularity are normal. No adenopathy. No pneumothorax. There is degenerative  change in each shoulder. IMPRESSION: Left base atelectasis. No edema or consolidation. Heart size within normal limits. Electronically Signed   By: Bretta Bang III M.D.   On: 08/24/2018 21:01   Ct Angio Chest Pe W/cm &/or Wo Cm  Result Date: 08/24/2018 CLINICAL DATA:  Chest pain EXAM: CT ANGIOGRAPHY CHEST WITH CONTRAST TECHNIQUE: Multidetector CT imaging of the chest was performed using the standard protocol during bolus administration of intravenous contrast. Multiplanar CT image reconstructions and MIPs were obtained to evaluate the vascular anatomy. CONTRAST:  OMNIPAQUE IOHEXOL 350  MG/ML SOLN COMPARISON:  Chest x-ray earlier today FINDINGS: Cardiovascular: Heart is normal size. Aorta is normal caliber. No filling defects in the pulmonary arteries to suggest pulmonary emboli. Scattered coronary artery calcifications. Mediastinum/Nodes: No mediastinal, hilar, or axillary adenopathy. Lungs/Pleura: 5 mm right middle lobe nodule on image 88. Small right upper lobe nodules on image 73, the largest 5-6 mm. No confluent opacities or effusions. Upper Abdomen: Imaging into the upper abdomen shows no acute findings. Musculoskeletal: Chest wall soft tissues are unremarkable. No acute bony abnormality. Review of the MIP images confirms the above findings. IMPRESSION: No evidence of pulmonary embolus. Scattered coronary artery calcifications. Small right pulmonary nodules, 5-6 mm. Non-contrast chest CT at 3-6 months is recommended. If the nodules are stable at time of repeat CT, then future CT at 18-24 months (from today's scan) is considered optional for low-risk patients, but is recommended for high-risk patients. This recommendation follows the consensus statement: Guidelines for Management of Incidental Pulmonary Nodules Detected on CT Images: From the Fleischner Society 2017; Radiology 2017; 284:228-243. Electronically Signed   By: Charlett Nose M.D.   On: 08/24/2018 22:50     Medications:  Scheduled: . amLODipine  10 mg Oral Daily  . aspirin EC  81 mg Oral Daily  . [START ON 08/26/2018] atorvastatin  20 mg Oral q1800  . [START ON 08/26/2018] pneumococcal 23 valent vaccine  0.5 mL Intramuscular Tomorrow-1000  . sodium chloride flush  3 mL Intravenous Q12H   Continuous: . sodium chloride 100 mL/hr at 08/25/18 0644  . sodium chloride    . sodium chloride 1 mL/kg/hr (08/25/18 0943)  . heparin 1,300 Units/hr (08/25/18 0644)  . nitroGLYCERIN 10 mcg/min (08/25/18 0644)   TWS:FKCLEX chloride, acetaminophen **OR** acetaminophen, morphine injection, ondansetron **OR** ondansetron (ZOFRAN) IV,  sodium chloride flush    Assessment/Plan:    Non-ST elevation MI Creatinine has increased to 2.76.  Patient currently is stable.  His symptoms are minimal.  Cardiology has seen the patient.  Plan is for cardiac catheterization later today.  Patient is on aspirin, heparin.  No beta-blocker due to baseline bradycardia.  He is also on nitroglycerin infusion.  LDL is 140.  He will need to be on a statin with a close watch on his LFTs.  See below.  Essential hypertension Patient has not been taking his medications for the past few years.  Continue to monitor blood pressures.  Mildly abnormal LFTs Etiology unclear.  Will need to be further evaluated in the outpatient setting.  We will recheck it tomorrow and if there is a significant increase then may have to initiate work-up in the hospital.  Mildly elevated lipase also noted although patient does not have any abdominal symptoms currently.  Normocytic anemia Hemoglobin 12.8.  Defer management to his outpatient providers.  Right pulmonary nodules Small nodules noted incidentally on CT scan.  This will need outpatient surveillance.  DVT Prophylaxis: Heparin infusion    Code Status: Full  code Family Communication: Discussed with the patient Disposition Plan: Management as outlined above.  Cardiac catheterization later today.    LOS: 0 days    [employer] on [email or web address]  08/25/2018, 10:09 AM

## 2018-08-25 NOTE — ED Notes (Signed)
ED TO INPATIENT HANDOFF REPORT  ED Nurse Name and Phone #: Jeanice Lim [phone]  S Name/Age/Gender Alexander Hughes 68 y.o. male Room/Bed: WA22/WA22  Code Status   Code Status: Full Code  Home/SNF/Other Home Patient oriented to: self Is this baseline? Yes   Triage Complete: Triage complete  Chief Complaint Chest pain, SOB  Triage Note Pt c/o chest pain that started @ 1844 Is centrally located.  Pt had weakness, vomited, diaphoretic & feels like pressure    Allergies No Known Allergies  Level of Care/Admitting Diagnosis ED Disposition    ED Disposition Condition Comment   Admit  Hospital Area: [facility] [[id number]]  Level of Care: Progressive [102]  I expect the patient will be discharged within 24 hours: No (not a candidate for 5C-Observation unit)  Diagnosis: Unstable angina [facility]) [[id number]]  Admitting Physician: Eduard Clos [phone]  Attending Physician: Eduard Clos [[id number]]  PT Class (Do Not Modify): Observation [104]  PT Acc Code (Do Not Modify): Observation [10022]       B Medical/Surgery History Past Medical History:  Diagnosis Date  . CHF (congestive heart failure) (HCC)    Past Surgical History:  Procedure Laterality Date  . right hand surgery       A IV Location/Drains/Wounds Patient Lines/Drains/Airways Status   Active Line/Drains/Airways    Name:   Placement date:   Placement time:   Site:   Days:   Peripheral IV 08/24/18 Right Antecubital   08/24/18    2029    Antecubital   1   Peripheral IV 08/25/18 Left Antecubital   08/25/18    0037    Antecubital   less than 1          Intake/Output Last 24 hours No intake or output data in the 24 hours ending 08/25/18 0232  Labs/Imaging Results for orders placed or performed during the hospital encounter of 08/24/18 (from the past 48 hour(s))  Basic metabolic panel     Status: Abnormal   Collection Time: 08/24/18  8:00 PM  Result Value Ref Range   Sodium 138 135 - 145  mmol/L   Potassium 3.8 3.5 - 5.1 mmol/L   Chloride 106 98 - 111 mmol/L   CO2 24 22 - 32 mmol/L   Glucose, Bld 126 (H) 70 - 99 mg/dL   BUN 25 (H) 8 - 23 mg/dL   Creatinine, Ser 9.62 0.61 - 1.24 mg/dL   Calcium 8.7 (L) 8.9 - 10.3 mg/dL   GFR calc non Af Amer >60 >60 mL/min   GFR calc Af Amer >60 >60 mL/min   Anion gap 8 5 - 15    Comment: Performed at [facility], [address], [place], [place] [address]  CBC     Status: Abnormal   Collection Time: 08/24/18  8:00 PM  Result Value Ref Range   WBC 6.4 4.0 - 10.5 K/uL   RBC 4.24 4.22 - 5.81 MIL/uL   Hemoglobin 12.8 (L) 13.0 - 17.0 g/dL   HCT 89.2 (L) 11.9 - 41.7 %   MCV 90.8 80.0 - 100.0 fL   MCH 30.2 26.0 - 34.0 pg   MCHC 33.2 30.0 - 36.0 g/dL   RDW 40.8 14.4 - 81.8 %   Platelets 181 150 - 400 K/uL   nRBC 0.0 0.0 - 0.2 %    Comment: Performed at [facility], [address] [place], [place] [address]  D-dimer, quantitative     Status: Abnormal  Collection Time: 08/24/18  8:27 PM  Result Value Ref Range   D-Dimer, Quant 0.63 (H) 0.00 - 0.50 ug/mL-FEU    Comment: (NOTE) At the manufacturer cut-off of 0.50 ug/mL FEU, this assay has been documented to exclude PE with a sensitivity and negative predictive value of 97 to 99%.  At this time, this assay has not been approved by the FDA to exclude DVT/VTE. Results should be correlated with clinical presentation. Performed at [facility], [address], [place], [place] [address]   I-stat troponin, ED     Status: None   Collection Time: 08/24/18  8:49 PM  Result Value Ref Range   Troponin i, poc 0.04 0.00 - 0.08 ng/mL   Comment 3            Comment: Due to the release kinetics of cTnI, a negative result within the first hours of the onset of symptoms does not rule out myocardial infarction with certainty. If myocardial infarction is still suspected, repeat the test at appropriate intervals.   Troponin I - Once      Status: Abnormal   Collection Time: 08/24/18 11:00 PM  Result Value Ref Range   Troponin I 0.24 (HH) <0.03 ng/mL    Comment: CRITICAL RESULT CALLED TO, READ BACK BY AND VERIFIED WITH: RN [facility] LACIVITI AT 2337 08/24/18 CRUICKSHANK A Performed at [facility], [address], [place], [place] [address]   Protime-INR     Status: None   Collection Time: 08/25/18 12:29 AM  Result Value Ref Range   Prothrombin Time 13.0 11.4 - 15.2 seconds   INR 1.0 0.8 - 1.2    Comment: (NOTE) INR goal varies based on device and disease states. Performed at [facility], [address] [place], [place] [address]   APTT     Status: None   Collection Time: 08/25/18 12:29 AM  Result Value Ref Range   aPTT 29 24 - 36 seconds    Comment: Performed at [facility], [address], [place], [place] [address]   Dg Chest 2 View  Result Date: 08/24/2018 CLINICAL DATA:  Chest pain and diaphoresis EXAM: CHEST - 2 VIEW COMPARISON:  None. FINDINGS: There is atelectatic change in the left base. The lungs elsewhere are clear. The heart size and pulmonary vascularity are normal. No adenopathy. No pneumothorax. There is degenerative change in each shoulder. IMPRESSION: Left base atelectasis. No edema or consolidation. Heart size within normal limits. Electronically Signed   By: Bretta Bang III M.D.   On: 08/24/2018 21:01   Ct Angio Chest Pe W/cm &/or Wo Cm  Result Date: 08/24/2018 CLINICAL DATA:  Chest pain EXAM: CT ANGIOGRAPHY CHEST WITH CONTRAST TECHNIQUE: Multidetector CT imaging of the chest was performed using the standard protocol during bolus administration of intravenous contrast. Multiplanar CT image reconstructions and MIPs were obtained to evaluate the vascular anatomy. CONTRAST:  OMNIPAQUE IOHEXOL 350 MG/ML SOLN COMPARISON:  Chest x-ray earlier today FINDINGS: Cardiovascular: Heart is normal size. Aorta is normal caliber. No filling defects in  the pulmonary arteries to suggest pulmonary emboli. Scattered coronary artery calcifications. Mediastinum/Nodes: No mediastinal, hilar, or axillary adenopathy. Lungs/Pleura: 5 mm right middle lobe nodule on image 88. Small right upper lobe nodules on image 73, the largest 5-6 mm. No confluent opacities or effusions. Upper Abdomen: Imaging into the upper abdomen shows no acute findings. Musculoskeletal: Chest wall soft tissues are unremarkable. No acute bony abnormality. Review of the MIP images  confirms the above findings. IMPRESSION: No evidence of pulmonary embolus. Scattered coronary artery calcifications. Small right pulmonary nodules, 5-6 mm. Non-contrast chest CT at 3-6 months is recommended. If the nodules are stable at time of repeat CT, then future CT at 18-24 months (from today's scan) is considered optional for low-risk patients, but is recommended for high-risk patients. This recommendation follows the consensus statement: Guidelines for Management of Incidental Pulmonary Nodules Detected on CT Images: From the Fleischner Society 2017; Radiology 2017; 284:228-243. Electronically Signed   By: Charlett Nose M.D.   On: 08/24/2018 22:50    Pending Labs Unresulted Labs (From admission, onward)    Start     Ordered   08/25/18 0800  Heparin level (unfractionated)  Once,   R     08/25/18 0125   08/25/18 0500  CBC  Daily,   R     08/25/18 0125   08/25/18 0500  Basic metabolic panel  Tomorrow morning,   R     08/25/18 0135   08/25/18 0136  Troponin I - Now Then Q6H  Now then every 6 hours,   R     08/25/18 0135   08/25/18 0136  Hepatic function panel  Once,   R     08/25/18 0135   08/25/18 0136  Lipase, blood  Once,   R     08/25/18 0135   08/25/18 0133  HIV antibody (Routine Testing)  Once,   R     08/25/18 0135          Vitals/Pain Today's Vitals   08/25/18 0000 08/25/18 0100 08/25/18 0130 08/25/18 0200  BP: (!) 158/96 (!) 164/94 (!) 165/91 (!) 162/93  Pulse: (!) 58 (!) 53 62 (!) 58   Resp: (!) 21 15 (!) 28 (!) 22  Temp:      TempSrc:      SpO2: 97% 100% 99% 97%  Weight:      Height:      PainSc:        Isolation Precautions No active isolations  Medications Medications  heparin ADULT infusion 100 units/mL (25000 units/252mL sodium chloride 0.45%) (1,300 Units/hr Intravenous New Bag/Given 08/25/18 0044)  nitroGLYCERIN 50 mg in dextrose 5 % 250 mL (0.2 mg/mL) infusion (5 mcg/min Intravenous New Bag/Given 08/25/18 0204)  0.9 %  sodium chloride infusion ( Intravenous New Bag/Given 08/25/18 0201)  acetaminophen (TYLENOL) tablet 650 mg (has no administration in time range)    Or  acetaminophen (TYLENOL) suppository 650 mg (has no administration in time range)  ondansetron (ZOFRAN) tablet 4 mg (has no administration in time range)    Or  ondansetron (ZOFRAN) injection 4 mg (has no administration in time range)  aspirin EC tablet 81 mg (has no administration in time range)  morphine 2 MG/ML injection 1 mg (has no administration in time range)  sodium chloride flush (NS) 0.9 % injection 3 mL (3 mLs Intravenous Given 08/24/18 2320)  aspirin chewable tablet 324 mg (324 mg Oral Given 08/24/18 2101)  morphine 4 MG/ML injection 4 mg (4 mg Intravenous Given 08/24/18 2102)  iohexol (OMNIPAQUE) 350 MG/ML injection 100 mL (100 mLs Intravenous Contrast Given 08/24/18 2226)  alum & mag hydroxide-simeth (MAALOX/MYLANTA) 200-200-20 MG/5ML suspension 30 mL (30 mLs Oral Given 08/24/18 2319)    And  lidocaine (XYLOCAINE) 2 % viscous mouth solution 15 mL (15 mLs Oral Given 08/24/18 2319)  heparin bolus via infusion 4,000 Units (4,000 Units Intravenous Bolus from Bag 08/25/18 0045)    Mobility walks Low  fall risk   Focused Assessments Cardiac Assessment Handoff:  Cardiac Rhythm: Normal sinus rhythm Lab Results  Component Value Date   TROPONINI 0.24 (HH) 08/24/2018   Lab Results  Component Value Date   DDIMER 0.63 (H) 08/24/2018   Does the Patient currently have chest pain? Yes      R Recommendations: See Admitting Provider Note  Report given to:   Additional Notes: NSTEMI

## 2018-08-25 NOTE — Interval H&P Note (Signed)
History and Physical Interval Note:  08/25/2018 1:37 PM  Alexander Hughes  has presented today for surgery, with the diagnosis of Non stemi.  The various methods of treatment have been discussed with the patient and family. After consideration of risks, benefits and other options for treatment, the patient has consented to  Procedure(s): LEFT HEART CATH AND CORONARY ANGIOGRAPHY (N/A) as a surgical intervention.  The patient's history has been reviewed, patient examined, no change in status, stable for surgery.  I have reviewed the patient's chart and labs.  Questions were answered to the patient's satisfaction.    NSTEMI/Unstable angina, stabilized patient at high risk Link Here: https://powell.info/ Indication:  Revascularization by PCI or CABG of 1 or more arteries in a patient with NSTEMI or unstable angina with Stabilization after presentation High risk for clinical events  A (7) Indication: 16; Score 7      J 

## 2018-08-25 NOTE — Progress Notes (Signed)
Admitted from [facility] via EMS,on Heparin/Nitroglycerin drip,chest pain free.Patient oriented to the room ,hooked to cardiac monitoring,CCMD notified.Will continue to monitor.

## 2018-08-25 NOTE — Progress Notes (Signed)
  Echocardiogram 2D Echocardiogram has been performed.  Alexander Hughes  08/25/2018, 9:46 AM

## 2018-08-25 NOTE — Progress Notes (Signed)
TR BAND REMOVAL  LOCATION:  right radial  DEFLATED PER PROTOCOL:  Yes.    TIME BAND OFF / DRESSING APPLIED:   1900   SITE UPON ARRIVAL:   Level 1  SITE AFTER BAND REMOVAL:  Level 2  CIRCULATION SENSATION AND MOVEMENT:  Within Normal Limits  Yes.    COMMENTS:  Arrived with small hematoma proximal to band, approx 3 cm X 3 cm.  Manual pressure held at three different times for hematoma of maximal dimensions of 9 cm X 5.5 cm. Coban wrap applied. Site is stable at this time; skin marked.  Patient compliant.

## 2018-08-25 NOTE — Consult Note (Signed)
CARDIOLOGY CONSULT NOTE  Patient ID: Alexander Hughes MRN: [id number] DOB/AGE: [birth year] 68 y.o.  Admit date: 08/24/2018 Referring Physician  [facility]  Primary Physician:  Tresa Garter, MD Reason for Consultation  Chest pain  HPI:   68 y.o. [place] male  with hypertension, no prior known cardiac history, nondiabetic, nonsmoker. He had an episode of retrosternal chest pain on 08/25/2018 while driving, up to 9/32, associated with nausea and diaphoresis. Pain has improved overnight with low dose nitroglycerin. He is currently chest pain free. D dimer was elevated. CTA showed no PE. Trop elevated 2.4.   He reports prior episodes of intermittent squeezing chest pain in the past, but had never sought help regarding this.   Past Medical History:  Diagnosis Date  . CHF (congestive heart failure) (HCC)      Past Surgical History:  Procedure Laterality Date  . right hand surgery       Family History  Problem Relation Age of Onset  . Heart disease Brother        arrhythmia   . Diabetes Mellitus II Neg Hx      Social History: Social History   Socioeconomic History  . Marital status: Married    Spouse name: Not on file  . Number of children: Not on file  . Years of education: Not on file  . Highest education level: Not on file  Occupational History  . Not on file  Social Needs  . Financial resource strain: Not on file  . Food insecurity:    Worry: Not on file    Inability: Not on file  . Transportation needs:    Medical: Not on file    Non-medical: Not on file  Tobacco Use  . Smoking status: Never Smoker  . Smokeless tobacco: Never Used  Substance and Sexual Activity  . Alcohol use: Yes    Comment: socially  . Drug use: No  . Sexual activity: Yes  Lifestyle  . Physical activity:    Days per week: Not on file    Minutes per session: Not on file  . Stress: Not on file  Relationships  . Social connections:    Talks on phone: Not on file     Gets together: Not on file    Attends religious service: Not on file    Active member of club or organization: Not on file    Attends meetings of clubs or organizations: Not on file    Relationship status: Not on file  . Intimate partner violence:    Fear of current or ex partner: Not on file    Emotionally abused: Not on file    Physically abused: Not on file    Forced sexual activity: Not on file  Other Topics Concern  . Not on file  Social History Narrative  . Not on file     Medications Prior to Admission  Medication Sig Dispense Refill Last Dose  . Cholecalciferol (VITAMIN D3) 2000 units capsule Take 1 capsule (2,000 Units total) by mouth daily. (Patient not taking: Reported on 08/24/2018) 100 capsule 3 Not Taking at Unknown time  . lisinopril (PRINIVIL,ZESTRIL) 20 MG tablet Take 1 tablet (20 mg total) by mouth daily. --patient needs office visit with pcp for future refills (Patient not taking: Reported on 08/24/2018) 90 tablet 0 Not Taking at Unknown time  . meloxicam (MOBIC) 15 MG tablet Take 1 tablet (15 mg total) by mouth daily. (Patient not taking: Reported on 08/24/2018) 30 tablet 1 Not  Taking at Unknown time  . omeprazole (PRILOSEC) 40 MG capsule Take 1 capsule (40 mg total) by mouth daily. --patient needs office visit with pcp for future refills (Patient not taking: Reported on 08/24/2018) 90 capsule 0 Not Taking at Unknown time    Review of Systems  Constitution: Negative for decreased appetite, malaise/fatigue, weight gain and weight loss.  HENT: Negative for congestion.   Eyes: Negative for visual disturbance.  Cardiovascular: Positive for chest pain. Negative for dyspnea on exertion, leg swelling, palpitations and syncope.  Respiratory: Negative for shortness of breath.   Endocrine: Negative for cold intolerance.  Hematologic/Lymphatic: Does not bruise/bleed easily.  Skin: Negative for itching and rash.  Musculoskeletal: Negative for myalgias.  Gastrointestinal:  Positive for nausea. Negative for abdominal pain and vomiting.  Genitourinary: Negative for dysuria.  Neurological: Negative for dizziness and weakness.  Psychiatric/Behavioral: The patient is not nervous/anxious.   All other systems reviewed and are negative.     Physical Exam: Physical Exam  Constitutional: He is oriented to person, place, and time. He appears well-developed and well-nourished. No distress.  HENT:  Head: Normocephalic and atraumatic.  Eyes: Pupils are equal, round, and reactive to light. Conjunctivae are normal.  Neck: No JVD present.  Cardiovascular: Normal rate, regular rhythm and intact distal pulses.  No murmur heard. Pulmonary/Chest: Effort normal and breath sounds normal. He has no wheezes. He has no rales.  Abdominal: Soft. Bowel sounds are normal. There is no rebound.  Musculoskeletal:        General: No edema.  Lymphadenopathy:    He has no cervical adenopathy.  Neurological: He is alert and oriented to person, place, and time. No cranial nerve deficit.  Skin: Skin is warm and dry.  Psychiatric: He has a normal mood and affect.  Nursing note and vitals reviewed.    Labs:   Lab Results  Component Value Date   WBC 6.4 08/24/2018   HGB 12.8 (L) 08/24/2018   HCT 38.5 (L) 08/24/2018   MCV 90.8 08/24/2018   PLT 181 08/24/2018    Recent Labs  Lab 08/24/18 2000 08/25/18 0204  NA 138  --   K 3.8  --   CL 106  --   CO2 24  --   BUN 25*  --   CREATININE 1.22  --   CALCIUM 8.7*  --   PROT  --  6.7  BILITOT  --  1.4*  ALKPHOS  --  73  ALT  --  35  AST  --  54*  GLUCOSE 126*  --     Lipid Panel     Component Value Date/Time   CHOL 224 (H) 02/17/2017 0849   TRIG 139.0 02/17/2017 0849   HDL 53.80 02/17/2017 0849   CHOLHDL 4 02/17/2017 0849   VLDL 27.8 02/17/2017 0849   LDLCALC 142 (H) 02/17/2017 0849    BNP (last 3 results) No results for input(s): BNP in the last 8760 hours.  HEMOGLOBIN A1C No results found for: HGBA1C,  MPG  Cardiac Panel (last 3 results) Recent Labs    08/24/18 2300 08/25/18 0204  TROPONINI 0.24* 2.76*    Lab Results  Component Value Date   TROPONINI 2.76 (HH) 08/25/2018     TSH No results for input(s): TSH in the last 8760 hours.    Radiology: Dg Chest 2 View  Result Date: 08/24/2018 CLINICAL DATA:  Chest pain and diaphoresis EXAM: CHEST - 2 VIEW COMPARISON:  None. FINDINGS: There is atelectatic change in the left   base. The lungs elsewhere are clear. The heart size and pulmonary vascularity are normal. No adenopathy. No pneumothorax. There is degenerative change in each shoulder. IMPRESSION: Left base atelectasis. No edema or consolidation. Heart size within normal limits. Electronically Signed   By: Bretta Bang III M.D.   On: 08/24/2018 21:01   Ct Angio Chest Pe W/cm &/or Wo Cm  Result Date: 08/24/2018 CLINICAL DATA:  Chest pain EXAM: CT ANGIOGRAPHY CHEST WITH CONTRAST TECHNIQUE: Multidetector CT imaging of the chest was performed using the standard protocol during bolus administration of intravenous contrast. Multiplanar CT image reconstructions and MIPs were obtained to evaluate the vascular anatomy. CONTRAST:  OMNIPAQUE IOHEXOL 350 MG/ML SOLN COMPARISON:  Chest x-ray earlier today FINDINGS: Cardiovascular: Heart is normal size. Aorta is normal caliber. No filling defects in the pulmonary arteries to suggest pulmonary emboli. Scattered coronary artery calcifications. Mediastinum/Nodes: No mediastinal, hilar, or axillary adenopathy. Lungs/Pleura: 5 mm right middle lobe nodule on image 88. Small right upper lobe nodules on image 73, the largest 5-6 mm. No confluent opacities or effusions. Upper Abdomen: Imaging into the upper abdomen shows no acute findings. Musculoskeletal: Chest wall soft tissues are unremarkable. No acute bony abnormality. Review of the MIP images confirms the above findings. IMPRESSION: No evidence of pulmonary embolus. Scattered coronary artery  calcifications. Small right pulmonary nodules, 5-6 mm. Non-contrast chest CT at 3-6 months is recommended. If the nodules are stable at time of repeat CT, then future CT at 18-24 months (from today's scan) is considered optional for low-risk patients, but is recommended for high-risk patients. This recommendation follows the consensus statement: Guidelines for Management of Incidental Pulmonary Nodules Detected on CT Images: From the Fleischner Society 2017; Radiology 2017; 284:228-243. Electronically Signed   By: Charlett Nose M.D.   On: 08/24/2018 22:50    Scheduled Meds: . aspirin EC  81 mg Oral Daily  . atorvastatin  80 mg Oral q1800   Continuous Infusions: . sodium chloride 100 mL/hr at 08/25/18 0644  . heparin 1,300 Units/hr (08/25/18 0644)  . nitroGLYCERIN 10 mcg/min (08/25/18 0644)   PRN Meds:.acetaminophen **OR** acetaminophen, morphine injection, ondansetron **OR** ondansetron (ZOFRAN) IV  CARDIAC STUDIES:  EKG 08/25/2018: Sinus rhythm. Normal axis. Normal conduction. Early R wave transition. No ST depression. Occasional PVC  Echocardiogram pending  Assessment & Recommendations:  68 y.o. [place] American male  with hypertension, admitted with NSTEMI  NSTEMI: Currently chest pain free. Hemodynamically and electrically stable. No dynamic EKG changes. Early R wave transition suspicious for posterior infarct. No ST depressions. Echocardiogram pending. Cath this afternoon. He received contrast load for CTA last night that showed no PE. Hydration ordered. Continue aspirin/heprain, low dose statin (See below) Bradycardiac at baseline, thus not on beta blocker.  Hold lisinopril pending cath today. Add amlodipine 10 mg for now. Will likely resume lisinopril on discharge.   Hypertension: AS above  Hyperlipidemia: Mild AST/ALT/bilirubin/lipase elevation. Will try low dose statin, lipitor 20 mg. Consider US liver if clinically indicated.    Elder Negus, MD 08/25/2018,  7:43 AM [facility] Pager: [phone] Office: [phone] If no answer Cell [phone]

## 2018-08-25 NOTE — Progress Notes (Signed)
ANTICOAGULATION CONSULT NOTE - Follow Up Consult  Pharmacy Consult for IV heparin Indication: chest pain/ACS  No Known Allergies  Patient Measurements: Height: 6\' 1"  (185.4 cm) Weight: 228 lb 9.9 oz (103.7 kg) IBW/kg (Calculated) : 79.9 Heparin Dosing Weight:   Vital Signs: Temp: 98.2 F (36.8 C) (03/20 0827) Temp Source: Oral (03/20 0827) BP: 149/81 (03/20 0827) Pulse Rate: 54 (03/20 0827)  Labs: Recent Labs    08/24/18 2000 08/24/18 2300 08/25/18 0029 08/25/18 0204 08/25/18 0804  HGB 12.8*  --   --   --   --   HCT 38.5*  --   --   --   --   PLT 181  --   --   --   --   APTT  --   --  29  --   --   LABPROT  --   --  13.0  --   --   INR  --   --  1.0  --   --   HEPARINUNFRC  --   --   --   --  0.31  CREATININE 1.22  --   --   --   --   TROPONINI  --  0.24*  --  2.76*  --     Estimated Creatinine Clearance: 72.3 mL/min (by C-G formula based on SCr of 1.22 mg/dL).   Medications:  Scheduled:  . amLODipine  10 mg Oral Daily  . aspirin EC  81 mg Oral Daily  . [START ON 08/26/2018] atorvastatin  20 mg Oral q1800  . sodium chloride flush  3 mL Intravenous Q12H    Assessment: 29 yoM admitted with ACS r/o. Pharmacy consulted for IV heparin. Initial heparin level therapeutic at 0.31, planning for left heart cath today.  Goal of Therapy:  Heparin level 0.3-0.7 units/ml Monitor platelets by anticoagulation protocol: Yes   Plan:  Heparin 1300 units/hr Defer further levels with impending cath  Fredonia Highland, PharmD, BCPS Clinical Pharmacist [phone] Please check AMION for all [facility] numbers 08/25/2018

## 2018-08-25 NOTE — H&P (View-Only) (Signed)
CARDIOLOGY CONSULT NOTE  Patient ID: Alexander Hughes MRN: [id number] DOB/AGE: [birth year] 68 y.o.  Admit date: 08/24/2018 Referring Physician  [facility]  Primary Physician:  Tresa Garter, MD Reason for Consultation  Chest pain  HPI:   68 y.o. [place] American male  with hypertension, no prior known cardiac history, nondiabetic, nonsmoker. He had an episode of retrosternal chest pain on 08/25/2018 while driving, up to 9/32, associated with nausea and diaphoresis. Pain has improved overnight with low dose nitroglycerin. He is currently chest pain free. D dimer was elevated. CTA showed no PE. Trop elevated 2.4.   He reports prior episodes of intermittent squeezing chest pain in the past, but had never sought help regarding this.   Past Medical History:  Diagnosis Date  . CHF (congestive heart failure) (HCC)      Past Surgical History:  Procedure Laterality Date  . right hand surgery       Family History  Problem Relation Age of Onset  . Heart disease Brother        arrhythmia   . Diabetes Mellitus II Neg Hx      Social History: Social History   Socioeconomic History  . Marital status: Married    Spouse name: Not on file  . Number of children: Not on file  . Years of education: Not on file  . Highest education level: Not on file  Occupational History  . Not on file  Social Needs  . Financial resource strain: Not on file  . Food insecurity:    Worry: Not on file    Inability: Not on file  . Transportation needs:    Medical: Not on file    Non-medical: Not on file  Tobacco Use  . Smoking status: Never Smoker  . Smokeless tobacco: Never Used  Substance and Sexual Activity  . Alcohol use: Yes    Comment: socially  . Drug use: No  . Sexual activity: Yes  Lifestyle  . Physical activity:    Days per week: Not on file    Minutes per session: Not on file  . Stress: Not on file  Relationships  . Social connections:    Talks on phone: Not on file     Gets together: Not on file    Attends religious service: Not on file    Active member of club or organization: Not on file    Attends meetings of clubs or organizations: Not on file    Relationship status: Not on file  . Intimate partner violence:    Fear of current or ex partner: Not on file    Emotionally abused: Not on file    Physically abused: Not on file    Forced sexual activity: Not on file  Other Topics Concern  . Not on file  Social History Narrative  . Not on file     Medications Prior to Admission  Medication Sig Dispense Refill Last Dose  . Cholecalciferol (VITAMIN D3) 2000 units capsule Take 1 capsule (2,000 Units total) by mouth daily. (Patient not taking: Reported on 08/24/2018) 100 capsule 3 Not Taking at Unknown time  . lisinopril (PRINIVIL,ZESTRIL) 20 MG tablet Take 1 tablet (20 mg total) by mouth daily. --patient needs office visit with pcp for future refills (Patient not taking: Reported on 08/24/2018) 90 tablet 0 Not Taking at Unknown time  . meloxicam (MOBIC) 15 MG tablet Take 1 tablet (15 mg total) by mouth daily. (Patient not taking: Reported on 08/24/2018) 30 tablet 1 Not  Taking at Unknown time  . omeprazole (PRILOSEC) 40 MG capsule Take 1 capsule (40 mg total) by mouth daily. --patient needs office visit with pcp for future refills (Patient not taking: Reported on 08/24/2018) 90 capsule 0 Not Taking at Unknown time    Review of Systems  Constitution: Negative for decreased appetite, malaise/fatigue, weight gain and weight loss.  HENT: Negative for congestion.   Eyes: Negative for visual disturbance.  Cardiovascular: Positive for chest pain. Negative for dyspnea on exertion, leg swelling, palpitations and syncope.  Respiratory: Negative for shortness of breath.   Endocrine: Negative for cold intolerance.  Hematologic/Lymphatic: Does not bruise/bleed easily.  Skin: Negative for itching and rash.  Musculoskeletal: Negative for myalgias.  Gastrointestinal:  Positive for nausea. Negative for abdominal pain and vomiting.  Genitourinary: Negative for dysuria.  Neurological: Negative for dizziness and weakness.  Psychiatric/Behavioral: The patient is not nervous/anxious.   All other systems reviewed and are negative.     Physical Exam: Physical Exam  Constitutional: He is oriented to person, place, and time. He appears well-developed and well-nourished. No distress.  HENT:  Head: Normocephalic and atraumatic.  Eyes: Pupils are equal, round, and reactive to light. Conjunctivae are normal.  Neck: No JVD present.  Cardiovascular: Normal rate, regular rhythm and intact distal pulses.  No murmur heard. Pulmonary/Chest: Effort normal and breath sounds normal. He has no wheezes. He has no rales.  Abdominal: Soft. Bowel sounds are normal. There is no rebound.  Musculoskeletal:        General: No edema.  Lymphadenopathy:    He has no cervical adenopathy.  Neurological: He is alert and oriented to person, place, and time. No cranial nerve deficit.  Skin: Skin is warm and dry.  Psychiatric: He has a normal mood and affect.  Nursing note and vitals reviewed.    Labs:   Lab Results  Component Value Date   WBC 6.4 08/24/2018   HGB 12.8 (L) 08/24/2018   HCT 38.5 (L) 08/24/2018   MCV 90.8 08/24/2018   PLT 181 08/24/2018    Recent Labs  Lab 08/24/18 2000 08/25/18 0204  NA 138  --   K 3.8  --   CL 106  --   CO2 24  --   BUN 25*  --   CREATININE 1.22  --   CALCIUM 8.7*  --   PROT  --  6.7  BILITOT  --  1.4*  ALKPHOS  --  73  ALT  --  35  AST  --  54*  GLUCOSE 126*  --     Lipid Panel     Component Value Date/Time   CHOL 224 (H) 02/17/2017 0849   TRIG 139.0 02/17/2017 0849   HDL 53.80 02/17/2017 0849   CHOLHDL 4 02/17/2017 0849   VLDL 27.8 02/17/2017 0849   LDLCALC 142 (H) 02/17/2017 0849    BNP (last 3 results) No results for input(s): BNP in the last 8760 hours.  HEMOGLOBIN A1C No results found for: HGBA1C,  MPG  Cardiac Panel (last 3 results) Recent Labs    08/24/18 2300 08/25/18 0204  TROPONINI 0.24* 2.76*    Lab Results  Component Value Date   TROPONINI 2.76 (HH) 08/25/2018     TSH No results for input(s): TSH in the last 8760 hours.    Radiology: Dg Chest 2 View  Result Date: 08/24/2018 CLINICAL DATA:  Chest pain and diaphoresis EXAM: CHEST - 2 VIEW COMPARISON:  None. FINDINGS: There is atelectatic change in the left  base. The lungs elsewhere are clear. The heart size and pulmonary vascularity are normal. No adenopathy. No pneumothorax. There is degenerative change in each shoulder. IMPRESSION: Left base atelectasis. No edema or consolidation. Heart size within normal limits. Electronically Signed   By: Bretta Bang III M.D.   On: 08/24/2018 21:01   Ct Angio Chest Pe W/cm &/or Wo Cm  Result Date: 08/24/2018 CLINICAL DATA:  Chest pain EXAM: CT ANGIOGRAPHY CHEST WITH CONTRAST TECHNIQUE: Multidetector CT imaging of the chest was performed using the standard protocol during bolus administration of intravenous contrast. Multiplanar CT image reconstructions and MIPs were obtained to evaluate the vascular anatomy. CONTRAST:  OMNIPAQUE IOHEXOL 350 MG/ML SOLN COMPARISON:  Chest x-ray earlier today FINDINGS: Cardiovascular: Heart is normal size. Aorta is normal caliber. No filling defects in the pulmonary arteries to suggest pulmonary emboli. Scattered coronary artery calcifications. Mediastinum/Nodes: No mediastinal, hilar, or axillary adenopathy. Lungs/Pleura: 5 mm right middle lobe nodule on image 88. Small right upper lobe nodules on image 73, the largest 5-6 mm. No confluent opacities or effusions. Upper Abdomen: Imaging into the upper abdomen shows no acute findings. Musculoskeletal: Chest wall soft tissues are unremarkable. No acute bony abnormality. Review of the MIP images confirms the above findings. IMPRESSION: No evidence of pulmonary embolus. Scattered coronary artery  calcifications. Small right pulmonary nodules, 5-6 mm. Non-contrast chest CT at 3-6 months is recommended. If the nodules are stable at time of repeat CT, then future CT at 18-24 months (from today's scan) is considered optional for low-risk patients, but is recommended for high-risk patients. This recommendation follows the consensus statement: Guidelines for Management of Incidental Pulmonary Nodules Detected on CT Images: From the Fleischner Society 2017; Radiology 2017; 284:228-243. Electronically Signed   By: Charlett Nose M.D.   On: 08/24/2018 22:50    Scheduled Meds: . aspirin EC  81 mg Oral Daily  . atorvastatin  80 mg Oral q1800   Continuous Infusions: . sodium chloride 100 mL/hr at 08/25/18 0644  . heparin 1,300 Units/hr (08/25/18 0644)  . nitroGLYCERIN 10 mcg/min (08/25/18 0644)   PRN Meds:.acetaminophen **OR** acetaminophen, morphine injection, ondansetron **OR** ondansetron (ZOFRAN) IV  CARDIAC STUDIES:  EKG 08/25/2018: Sinus rhythm. Normal axis. Normal conduction. Early R wave transition. No ST depression. Occasional PVC  Echocardiogram pending  Assessment & Recommendations:  68 y.o. [place] male  with hypertension, admitted with NSTEMI  NSTEMI: Currently chest pain free. Hemodynamically and electrically stable. No dynamic EKG changes. Early R wave transition suspicious for posterior infarct. No ST depressions. Echocardiogram pending. Cath this afternoon. He received contrast load for CTA last night that showed no PE. Hydration ordered. Continue aspirin/heprain, low dose statin (See below) Bradycardiac at baseline, thus not on beta blocker.  Hold lisinopril pending cath today. Add amlodipine 10 mg for now. Will likely resume lisinopril on discharge.   Hypertension: AS above  Hyperlipidemia: Mild AST/ALT/bilirubin/lipase elevation. Will try low dose statin, lipitor 20 mg. Consider US liver if clinically indicated.    Alexander Negus, MD 08/25/2018,  7:43 AM [facility] [place] Pager: [phone] Office: [phone] If no answer Cell [phone]

## 2018-08-25 NOTE — H&P (Signed)
History and Physical    Alexander Hughes ZOX:[id number] DOB: 27-Sep-1950 DOA: 08/24/2018  PCP: Tresa Garter, MD  Patient coming from: Home.  Chief Complaint: Chest pain.  HPI: Alexander Hughes is a 68 y.o. male with history of hypertension who has not been taking his medications for last couple of years presents to the ER with complaint of chest pain.  Patient started having substernal chest pressure while driving car.  Eventually pain started radiating to his left and right arm.  Denies any shortness of breath productive cough fever or chills.  ED Course: In the ER EKG was showing normal sinus rhythm.  Initial troponin was negative his second 1 was positive at 0.24.  Patient's pain improved with morphine but still persisted.  Patient admitted for unstable angina/non-ST elevation MI.  Was started on heparin.  Review of Systems: As per HPI, rest all negative.   Past Medical History:  Diagnosis Date  . CHF (congestive heart failure) (HCC)     Past Surgical History:  Procedure Laterality Date  . right hand surgery       reports that he has never smoked. He has never used smokeless tobacco. He reports current alcohol use. He reports that he does not use drugs.  No Known Allergies  Family History  Problem Relation Age of Onset  . Heart disease Brother        arrhythmia   . Diabetes Mellitus II Neg Hx     Prior to Admission medications   Medication Sig Start Date End Date Taking? Authorizing Provider  Cholecalciferol (VITAMIN D3) 2000 units capsule Take 1 capsule (2,000 Units total) by mouth daily. Patient not taking: Reported on 08/24/2018 02/16/17   Plotnikov, Georgina Quint, MD  lisinopril (PRINIVIL,ZESTRIL) 20 MG tablet Take 1 tablet (20 mg total) by mouth daily. --patient needs office visit with pcp for future refills Patient not taking: Reported on 08/24/2018 06/05/18   Plotnikov, Georgina Quint, MD  meloxicam (MOBIC) 15 MG tablet Take 1 tablet (15 mg total) by mouth daily.  Patient not taking: Reported on 08/24/2018 03/15/16   Plotnikov, Georgina Quint, MD  omeprazole (PRILOSEC) 40 MG capsule Take 1 capsule (40 mg total) by mouth daily. --patient needs office visit with pcp for future refills Patient not taking: Reported on 08/24/2018 06/05/18   Tresa Garter, MD    Physical Exam: Vitals:   08/24/18 2300 08/24/18 2330 08/25/18 0000 08/25/18 0100  BP: (!) 162/93 (!) 159/94 (!) 158/96 (!) 164/94  Pulse: 62 (!) 58 (!) 58 (!) 53  Resp: 15 15 (!) 21 15  Temp:      TempSrc:      SpO2: 97% 100% 97% 100%  Weight:      Height:          Constitutional: Moderately built and nourished. Vitals:   08/24/18 2300 08/24/18 2330 08/25/18 0000 08/25/18 0100  BP: (!) 162/93 (!) 159/94 (!) 158/96 (!) 164/94  Pulse: 62 (!) 58 (!) 58 (!) 53  Resp: 15 15 (!) 21 15  Temp:      TempSrc:      SpO2: 97% 100% 97% 100%  Weight:      Height:       Eyes: Anicteric no pallor. ENMT: No discharge from the ears eyes nose and mouth. Neck: No mass felt.  No neck rigidity. Respiratory: No rhonchi or crepitations. Cardiovascular: S1-S2 heard. Abdomen: Soft nontender bowel sounds present. Musculoskeletal: No edema. Skin: No rash. Neurologic: Alert awake oriented to time place and  person.  Moves all extremities.  Psychiatric: Appears normal.   Labs on Admission: I have personally reviewed following labs and imaging studies  CBC: Recent Labs  Lab 08/24/18 2000  WBC 6.4  HGB 12.8*  HCT 38.5*  MCV 90.8  PLT 181   Basic Metabolic Panel: Recent Labs  Lab 08/24/18 2000  NA 138  K 3.8  CL 106  CO2 24  GLUCOSE 126*  BUN 25*  CREATININE 1.22  CALCIUM 8.7*   GFR: Estimated Creatinine Clearance: 69.9 mL/min (by C-G formula based on SCr of 1.22 mg/dL). Liver Function Tests: No results for input(s): AST, ALT, ALKPHOS, BILITOT, PROT, ALBUMIN in the last 168 hours. No results for input(s): LIPASE, AMYLASE in the last 168 hours. No results for input(s): AMMONIA in the  last 168 hours. Coagulation Profile: Recent Labs  Lab 08/25/18 0029  INR 1.0   Cardiac Enzymes: Recent Labs  Lab 08/24/18 2300  TROPONINI 0.24*   BNP (last 3 results) No results for input(s): PROBNP in the last 8760 hours. HbA1C: No results for input(s): HGBA1C in the last 72 hours. CBG: No results for input(s): GLUCAP in the last 168 hours. Lipid Profile: No results for input(s): CHOL, HDL, LDLCALC, TRIG, CHOLHDL, LDLDIRECT in the last 72 hours. Thyroid Function Tests: No results for input(s): TSH, T4TOTAL, FREET4, T3FREE, THYROIDAB in the last 72 hours. Anemia Panel: No results for input(s): VITAMINB12, FOLATE, FERRITIN, TIBC, IRON, RETICCTPCT in the last 72 hours. Urine analysis:    Component Value Date/Time   COLORURINE YELLOW 02/17/2017 0849   APPEARANCEUR CLEAR 02/17/2017 0849   LABSPEC 1.015 02/17/2017 0849   PHURINE 6.5 02/17/2017 0849   GLUCOSEU NEGATIVE 02/17/2017 0849   HGBUR NEGATIVE 02/17/2017 0849   BILIRUBINUR NEGATIVE 02/17/2017 0849   KETONESUR NEGATIVE 02/17/2017 0849   UROBILINOGEN 0.2 02/17/2017 0849   NITRITE NEGATIVE 02/17/2017 0849   LEUKOCYTESUR NEGATIVE 02/17/2017 0849   Sepsis Labs: @LABRCNTIP (procalcitonin:4,lacticidven:4) )No results found for this or any previous visit (from the past 240 hour(s)).   Radiological Exams on Admission: Dg Chest 2 View  Result Date: 08/24/2018 CLINICAL DATA:  Chest pain and diaphoresis EXAM: CHEST - 2 VIEW COMPARISON:  None. FINDINGS: There is atelectatic change in the left base. The lungs elsewhere are clear. The heart size and pulmonary vascularity are normal. No adenopathy. No pneumothorax. There is degenerative change in each shoulder. IMPRESSION: Left base atelectasis. No edema or consolidation. Heart size within normal limits. Electronically Signed   By: Bretta Bang III M.D.   On: 08/24/2018 21:01   Ct Angio Chest Pe W/cm &/or Wo Cm  Result Date: 08/24/2018 CLINICAL DATA:  Chest pain EXAM: CT  ANGIOGRAPHY CHEST WITH CONTRAST TECHNIQUE: Multidetector CT imaging of the chest was performed using the standard protocol during bolus administration of intravenous contrast. Multiplanar CT image reconstructions and MIPs were obtained to evaluate the vascular anatomy. CONTRAST:  OMNIPAQUE IOHEXOL 350 MG/ML SOLN COMPARISON:  Chest x-ray earlier today FINDINGS: Cardiovascular: Heart is normal size. Aorta is normal caliber. No filling defects in the pulmonary arteries to suggest pulmonary emboli. Scattered coronary artery calcifications. Mediastinum/Nodes: No mediastinal, hilar, or axillary adenopathy. Lungs/Pleura: 5 mm right middle lobe nodule on image 88. Small right upper lobe nodules on image 73, the largest 5-6 mm. No confluent opacities or effusions. Upper Abdomen: Imaging into the upper abdomen shows no acute findings. Musculoskeletal: Chest wall soft tissues are unremarkable. No acute bony abnormality. Review of the MIP images confirms the above findings. IMPRESSION: No evidence of  pulmonary embolus. Scattered coronary artery calcifications. Small right pulmonary nodules, 5-6 mm. Non-contrast chest CT at 3-6 months is recommended. If the nodules are stable at time of repeat CT, then future CT at 18-24 months (from today's scan) is considered optional for low-risk patients, but is recommended for high-risk patients. This recommendation follows the consensus statement: Guidelines for Management of Incidental Pulmonary Nodules Detected on CT Images: From the Fleischner Society 2017; Radiology 2017; 284:228-243. Electronically Signed   By: Charlett Nose M.D.   On: 08/24/2018 22:50    EKG: Independently reviewed.  Normal sinus rhythm.  Assessment/Plan Principal Problem:   Unstable angina (HCC) Active Problems:   HTN (hypertension)    1. Non-ST elevation MI/unstable angina -patient still has mild pain.  Discussed with on-call cardiologist Dr. Rosemary Holms will be seeing patient in consult.  We will  keep patient n.p.o. continue heparin Dr. Rosemary Holms advised to start patient on nitroglycerin infusion.  Continue aspirin.  Not on beta-blockers due to heart rate being low normal.  Cycle cardiac markers.  Transfer to [facility] for possible cardiac cath.  Will keep patient on Lipitor. 2. Hypertension has not taken his medication for last few years.  Presently on nitroglycerin infusion.  Closely for blood pressure trends. 3. Normocytic normochromic anemia follow CBC will need further work-up as outpatient if there is no significant fall in hemoglobin.   LFTs and lipase are pending.   DVT prophylaxis: Heparin. Code Status: Full code. Family Communication: Discussed with patient. Disposition Plan: Home. Consults called: Cardiology. Admission status: Observation.   Eduard Clos MD [facility] Pager [phone].  If 7PM-7AM, please contact night-coverage [email or web address] Password [email or web address]  08/25/2018, 1:35 AM

## 2018-08-25 NOTE — ED Notes (Signed)
Hospitalist at bedside 

## 2018-08-25 NOTE — ED Notes (Signed)
[facility] arrived for transport 

## 2018-08-25 NOTE — Progress Notes (Signed)
ANTICOAGULATION CONSULT NOTE - Follow Up Consult  Pharmacy Consult for IV heparin Indication: chest pain/ACS  No Known Allergies  Patient Measurements: Height: 6' (182.9 cm) Weight: 220 lb (99.8 kg) IBW/kg (Calculated) : 77.6 Heparin Dosing Weight:   Vital Signs: Temp: 98.6 F (37 C) (03/19 2003) Temp Source: Oral (03/19 2003) BP: 158/96 (03/20 0000) Pulse Rate: 58 (03/20 0000)  Labs: Recent Labs    08/24/18 2000 08/24/18 2300 08/25/18 0029  HGB 12.8*  --   --   HCT 38.5*  --   --   PLT 181  --   --   APTT  --   --  29  LABPROT  --   --  13.0  INR  --   --  1.0  CREATININE 1.22  --   --   TROPONINI  --  0.24*  --     Estimated Creatinine Clearance: 69.9 mL/min (by C-G formula based on SCr of 1.22 mg/dL).   Medications:  Scheduled:    Assessment: Pharmacy is consulted to dose heparin in 68 yo male diagnosed with ACS. Pt home med list does not include and blood thinners or antiplatelet meds.  Today, 08/25/18   Baseline PT 13, INR 1.0   APTT 29   Scr 1.22 mg/dl, CrCl 70 ml/min  Hgb 41.2, plt 181   Goal of Therapy:  Heparin level 0.3-0.7 units/ml Monitor platelets by anticoagulation protocol: Yes   Plan:   Heparin 4000 unit bolus, then heparin 1300 units/hr  Daily CBC while on heparin   HL 6 hours after start of infusion  Monitor for signs and symptoms of bleeding   Adalberto Cole, PharmD, BCPS Pager [phone] 08/25/2018 1:25 AM

## 2018-08-25 NOTE — ED Notes (Signed)
[facility] called for transport. 

## 2018-08-26 ENCOUNTER — Other Ambulatory Visit: Payer: Self-pay | Admitting: Cardiology

## 2018-08-26 DIAGNOSIS — I77810 Thoracic aortic ectasia: Secondary | ICD-10-CM

## 2018-08-26 DIAGNOSIS — I10 Essential (primary) hypertension: Secondary | ICD-10-CM

## 2018-08-26 DIAGNOSIS — R748 Abnormal levels of other serum enzymes: Secondary | ICD-10-CM

## 2018-08-26 DIAGNOSIS — Z23 Encounter for immunization: Secondary | ICD-10-CM | POA: Diagnosis not present

## 2018-08-26 DIAGNOSIS — I214 Non-ST elevation (NSTEMI) myocardial infarction: Secondary | ICD-10-CM | POA: Diagnosis not present

## 2018-08-26 LAB — CBC
HCT: 36.9 % — ABNORMAL LOW (ref 39.0–52.0)
Hemoglobin: 12.7 g/dL — ABNORMAL LOW (ref 13.0–17.0)
MCH: 30.5 pg (ref 26.0–34.0)
MCHC: 34.4 g/dL (ref 30.0–36.0)
MCV: 88.5 fL (ref 80.0–100.0)
Platelets: 180 10*3/uL (ref 150–400)
RBC: 4.17 MIL/uL — ABNORMAL LOW (ref 4.22–5.81)
RDW: 12.9 % (ref 11.5–15.5)
WBC: 7.3 10*3/uL (ref 4.0–10.5)
nRBC: 0 % (ref 0.0–0.2)

## 2018-08-26 LAB — LIPASE, BLOOD: Lipase: 58 U/L — ABNORMAL HIGH (ref 11–51)

## 2018-08-26 LAB — COMPREHENSIVE METABOLIC PANEL
ALT: 62 U/L — ABNORMAL HIGH (ref 0–44)
AST: 133 U/L — ABNORMAL HIGH (ref 15–41)
Albumin: 3.6 g/dL (ref 3.5–5.0)
Alkaline Phosphatase: 70 U/L (ref 38–126)
Anion gap: 9 (ref 5–15)
BUN: 15 mg/dL (ref 8–23)
CO2: 20 mmol/L — ABNORMAL LOW (ref 22–32)
Calcium: 8.8 mg/dL — ABNORMAL LOW (ref 8.9–10.3)
Chloride: 107 mmol/L (ref 98–111)
Creatinine, Ser: 1.19 mg/dL (ref 0.61–1.24)
GFR calc Af Amer: >60 mL/min (ref >60)
GFR calc non Af Amer: >60 mL/min (ref >60)
Glucose, Bld: 106 mg/dL — ABNORMAL HIGH (ref 70–99)
Potassium: 4.3 mmol/L (ref 3.5–5.1)
Sodium: 136 mmol/L (ref 135–145)
Total Bilirubin: 2 mg/dL — ABNORMAL HIGH (ref 0.3–1.2)
Total Protein: 6.3 g/dL — ABNORMAL LOW (ref 6.5–8.1)

## 2018-08-26 LAB — HEMOGLOBIN A1C
Hgb A1c MFr Bld: 5.4 % (ref 4.8–5.6)
Mean Plasma Glucose: 108.28 mg/dL

## 2018-08-26 LAB — TROPONIN I: Troponin I: 2.76 ng/mL (ref <0.03)

## 2018-08-26 MED ORDER — METOPROLOL SUCCINATE ER 25 MG PO TB24
25.0000 mg | ORAL_TABLET | Freq: Every day | ORAL | Status: DC
  Administered 2018-08-26: 25 mg via ORAL
  Filled 2018-08-26: qty 1

## 2018-08-26 MED ORDER — METOPROLOL SUCCINATE ER 25 MG PO TB24: 25.0000 mg | ORAL_TABLET | Freq: Every day | ORAL | 3 refills | Status: DC

## 2018-08-26 MED ORDER — ACETAMINOPHEN 325 MG PO TABS: 650.0000 mg | ORAL_TABLET | ORAL | Status: DC | PRN

## 2018-08-26 MED ORDER — NITROGLYCERIN 0.4 MG SL SUBL: 0.4000 mg | SUBLINGUAL_TABLET | SUBLINGUAL | 3 refills | Status: DC | PRN

## 2018-08-26 NOTE — Discharge Summary (Addendum)
Physician Discharge Summary  Alexander Hughes  QMK:[id number]  DOB: [birth year]  DOA: 08/24/2018 PCP: Tresa Garter, MD  Admit date: 08/24/2018 Discharge date: 08/26/2018  Admitted From: Home Disposition: Home  Recommendations for Outpatient Follow-up:  1. Follow up with PCP in 1-2 weeks 2. Please obtain CMP/CBC in one week to monitor hemoglobin and LFTs. 3. Repeat lipid panels in 8 to 12 weeks. 4. Right pulmonary nodule surveillance 5. Cardiology follow-up 6. Follow-up on aortic root dilation, CT/MRI angiography surveillance  Discharge Condition: Stable CODE STATUS: Full code Diet recommendation: Heart Healthy  Brief/Interim Summary: For full details see H&P/Progress note, but in brief, Alexander Hughes is a 68 year old male with past medical history of essential hypertension who has not been compliant with medication for the past 2 years presented to the emergency department complaining of chest pain.  Patient was admitted for evaluation of ACS.  Troponin I 0.76, EKG early R wave transition suspicious for posterior infarct.  No ST depression.  Echocardiogram showed EF of 60 to 65%, normal diastolic function.  Moderate dilation of the aortic root and mild dilation of the ascending aorta.  Cardiology was consulted and patient underwent PCI, total occlusion of OM1 was found. Intervention aspiration, thrombectomy and stent placement was performed.  Patient has been pain-free, cardiology recommended medical treatment and deemed stable for discharge.  Patient was placed on aspirin and Brilinta for 1 year, pantoprazole for gastric protection and Lipitor.  He was also started on metoprolol succinate 25 mg.  Subjective: Patient seen and examined, he has no complaints today.  Denies chest pain, shortness of breath.  He is ambulating with no chest pain.  Tolerating diet well.  No signs of bleeding.  He is afebrile.  Discharge Diagnoses/Hospital Course:  NSTEMI Status post PCI, found total  occlusion of OM1.  PTCA and stent placement in mid OM1.  Mild to moderate disease recommending medical management.  Patient will be on aspirin/Brilinta for 1 year.  Lipitor 20 mg for now in view of mild LFT elevation.  Started on metoprolol succinate 25 mg.  Per cardiology no need for lisinopril at this time.  Recommend Protonix for gastric protection. Follow-up with cardiology   Hypertension BP stable upon discharge, continue metoprolol succinate 25 mg daily.  Low-salt diet encouraged.  Follow-up with PCP.  Mild aortic root dilation 4.2 cm with ascending aorta up to 4.5 cm.  Outpatient follow-up recommended.  Hyperlipidemia Started on Lipitor 20 mg nightly.  Monitor liver enzymes.  Low-fat diet recommended, outpatient surveillance recommended in 8 to 12-week..  Mild elevated LFTs Unclear etiology at this time, repeat LFTs in 4 to 6 weeks.  Follow-up with PCP.  Normocytic anemia Globin 12.7 upon discharge, no signs of overt bleeding.  Outpatient evaluation recommended.  Right pulmonary nodules Incidental finding, CT surveillance in 6 months to 1 year.  On the day of the discharge the patient's vitals were stable, and no other acute medical condition were reported by patient. the patient was felt safe to be discharge to home  Discharge Instructions  You were cared for by a hospitalist during your hospital stay. If you have any questions about your discharge medications or the care you received while you were in the hospital after you are discharged, you can call the unit and asked to speak with the hospitalist on call if the hospitalist that took care of you is not available. Once you are discharged, your primary care physician will handle any further medical issues. Please note that NO REFILLS for  any discharge medications will be authorized once you are discharged, as it is imperative that you return to your primary care physician (or establish a relationship with a primary care physician if  you do not have one) for your aftercare needs so that they can reassess your need for medications and monitor your lab values.  Discharge Instructions    AMB Referral to Cardiac Rehabilitation - Phase II   Complete by:  As directed    Diagnosis:   Coronary Stents NSTEMI     Call MD for:   Complete by:  As directed    Call MD for:  difficulty breathing, headache or visual disturbances   Complete by:  As directed    Call MD for:  extreme fatigue   Complete by:  As directed    Call MD for:  hives   Complete by:  As directed    Call MD for:  persistant dizziness or light-headedness   Complete by:  As directed    Call MD for:  persistant nausea and vomiting   Complete by:  As directed    Call MD for:  redness, tenderness, or signs of infection (pain, swelling, redness, odor or green/yellow discharge around incision site)   Complete by:  As directed    Call MD for:  severe uncontrolled pain   Complete by:  As directed    Call MD for:  temperature >100.4   Complete by:  As directed    Diet - low sodium heart healthy   Complete by:  As directed    Increase activity slowly   Complete by:  As directed      Allergies as of 08/26/2018   No Known Allergies     Medication List    STOP taking these medications   lisinopril 20 MG tablet Commonly known as:  PRINIVIL,ZESTRIL   meloxicam 15 MG tablet Commonly known as:  MOBIC   omeprazole 40 MG capsule Commonly known as:  PRILOSEC   Vitamin D3 50 MCG (2000 UT) capsule     TAKE these medications   acetaminophen 325 MG tablet Commonly known as:  TYLENOL Take 2 tablets (650 mg total) by mouth every 4 (four) hours as needed for headache or mild pain.   aspirin 81 MG EC tablet Take 1 tablet (81 mg total) by mouth daily.   atorvastatin 20 MG tablet Commonly known as:  LIPITOR Take 1 tablet (20 mg total) by mouth daily at 6 PM.   metoprolol succinate 25 MG 24 hr tablet Commonly known as:  TOPROL-XL Take 1 tablet (25 mg total) by  mouth daily.   nitroGLYCERIN 0.4 MG SL tablet Commonly known as:  Nitrostat Place 1 tablet (0.4 mg total) under the tongue every 5 (five) minutes as needed for chest pain.   pantoprazole 40 MG tablet Commonly known as:  Protonix Take 1 tablet (40 mg total) by mouth daily.   ticagrelor 90 MG Tabs tablet Commonly known as:  Brilinta Take 1 tablet (90 mg total) by mouth 2 (two) times daily.       No Known Allergies  Consultations:  Cardiology   Procedures/Studies: Dg Chest 2 View  Result Date: 08/24/2018 CLINICAL DATA:  Chest pain and diaphoresis EXAM: CHEST - 2 VIEW COMPARISON:  None. FINDINGS: There is atelectatic change in the left base. The lungs elsewhere are clear. The heart size and pulmonary vascularity are normal. No adenopathy. No pneumothorax. There is degenerative change in each shoulder. IMPRESSION: Left base atelectasis. No edema  or consolidation. Heart size within normal limits. Electronically Signed   By: Bretta Bang III M.D.   On: 08/24/2018 21:01   Ct Angio Chest Pe W/cm &/or Wo Cm  Result Date: 08/24/2018 CLINICAL DATA:  Chest pain EXAM: CT ANGIOGRAPHY CHEST WITH CONTRAST TECHNIQUE: Multidetector CT imaging of the chest was performed using the standard protocol during bolus administration of intravenous contrast. Multiplanar CT image reconstructions and MIPs were obtained to evaluate the vascular anatomy. CONTRAST:  OMNIPAQUE IOHEXOL 350 MG/ML SOLN COMPARISON:  Chest x-ray earlier today FINDINGS: Cardiovascular: Heart is normal size. Aorta is normal caliber. No filling defects in the pulmonary arteries to suggest pulmonary emboli. Scattered coronary artery calcifications. Mediastinum/Nodes: No mediastinal, hilar, or axillary adenopathy. Lungs/Pleura: 5 mm right middle lobe nodule on image 88. Small right upper lobe nodules on image 73, the largest 5-6 mm. No confluent opacities or effusions. Upper Abdomen: Imaging into the upper abdomen shows no acute  findings. Musculoskeletal: Chest wall soft tissues are unremarkable. No acute bony abnormality. Review of the MIP images confirms the above findings. IMPRESSION: No evidence of pulmonary embolus. Scattered coronary artery calcifications. Small right pulmonary nodules, 5-6 mm. Non-contrast chest CT at 3-6 months is recommended. If the nodules are stable at time of repeat CT, then future CT at 18-24 months (from today's scan) is considered optional for low-risk patients, but is recommended for high-risk patients. This recommendation follows the consensus statement: Guidelines for Management of Incidental Pulmonary Nodules Detected on CT Images: From the Fleischner Society 2017; Radiology 2017; 284:228-243. Electronically Signed   By: Charlett Nose M.D.   On: 08/24/2018 22:50    Echocardiogram 08/25/2018 1. The left ventricle has normal systolic function with an ejection fraction of 60-65%. The cavity size was normal. Left ventricular diastolic Doppler parameters are consistent with impaired relaxation. No evidence of left ventricular regional wall  motion abnormalities.  2. The right ventricle has normal systolic function. The cavity was normal. There is no increase in right ventricular wall thickness. Right ventricular systolic pressure normal with an estimated pressure of 20.8 mmHg.  3. Left atrial size was mildly dilated.  4. The aortic valve is tricuspid. Aortic valve regurgitation is mild by color flow Doppler.  5. There is moderate dilatation of the aortic root and mild dilatation of the ascending aorta. Consider further imaging such as CT or MR angiography for further evaluation.  6. The mitral valve is grossly normal.  7. The tricuspid valve is grossly normal.  Discharge Exam: Vitals:   08/26/18 0723 08/26/18 0725  BP: 127/77 127/77  Pulse: 73 65  Resp: 19 16  Temp: 98.1 F (36.7 C)   SpO2: 100% 99%   Vitals:   08/25/18 1915 08/26/18 0501 08/26/18 0723 08/26/18 0725  BP: (!) 144/77  136/68 127/77 127/77  Pulse: 66 60 73 65  Resp: Temp: 99.6 F (37.6 C) 98.3 F (36.8 C) 98.1 F (36.7 C)   TempSrc: Oral Oral Oral   SpO2: 98% 97% 100% 99%  Weight:  100.5 kg    Height:        General: Pt is alert, awake, not in acute distress Cardiovascular: RRR, S1/S2 +, no rubs, no gallops Respiratory: CTA bilaterally, no wheezing, no rhonchi Abdominal: Soft, NT, ND, bowel sounds + Extremities: no edema, no cyanosis   The results of significant diagnostics from this hospitalization (including imaging, microbiology, ancillary and laboratory) are listed below for reference.     Microbiology: No results found for  this or any previous visit (from the past 240 hour(s)).   Labs: BNP (last 3 results) No results for input(s): BNP in the last 8760 hours. Basic Metabolic Panel: Recent Labs  Lab 08/24/18 2000 08/25/18 0804 08/26/18 0423  NA 138 137 136  K 3.8 4.0 4.3  CL 106 106 107  CO2 24 23 20*  GLUCOSE 126* 106* 106*  BUN 25* 19 15  CREATININE 1.22 0.99 1.19  CALCIUM 8.7* 8.5* 8.8*   Liver Function Tests: Recent Labs  Lab 08/25/18 0204 08/26/18 0423  AST 54* 133*  ALT 35 62*  ALKPHOS 73 70  BILITOT 1.4* 2.0*  PROT 6.7 6.3*  ALBUMIN 4.0 3.6   Recent Labs  Lab 08/25/18 0204 08/26/18 0423  LIPASE 59* 58*   No results for input(s): AMMONIA in the last 168 hours. CBC: Recent Labs  Lab 08/24/18 2000 08/26/18 0423  WBC 6.4 7.3  HGB 12.8* 12.7*  HCT 38.5* 36.9*  MCV 90.8 88.5  PLT 181 180   Cardiac Enzymes: Recent Labs  Lab 08/24/18 2300 08/25/18 0204  TROPONINI 0.24* 2.76*   BNP: Invalid input(s): POCBNP CBG: No results for input(s): GLUCAP in the last 168 hours. D-Dimer Recent Labs    08/24/18 2027  DDIMER 0.63*   Hgb A1c Recent Labs    08/26/18 0423  HGBA1C 5.4   Lipid Profile Recent Labs    08/25/18 0804  CHOL 205*  HDL 45  LDLCALC 140*  TRIG 101  CHOLHDL 4.6   Thyroid function studies No results for  input(s): TSH, T4TOTAL, T3FREE, THYROIDAB in the last 72 hours.  Invalid input(s): FREET3 Anemia work up No results for input(s): VITAMINB12, FOLATE, FERRITIN, TIBC, IRON, RETICCTPCT in the last 72 hours. Urinalysis    Component Value Date/Time   COLORURINE YELLOW 02/17/2017 0849   APPEARANCEUR CLEAR 02/17/2017 0849   LABSPEC 1.015 02/17/2017 0849   PHURINE 6.5 02/17/2017 0849   GLUCOSEU NEGATIVE 02/17/2017 0849   HGBUR NEGATIVE 02/17/2017 0849   BILIRUBINUR NEGATIVE 02/17/2017 0849   KETONESUR NEGATIVE 02/17/2017 0849   UROBILINOGEN 0.2 02/17/2017 0849   NITRITE NEGATIVE 02/17/2017 0849   LEUKOCYTESUR NEGATIVE 02/17/2017 0849   Sepsis Labs Invalid input(s): PROCALCITONIN,  WBC,  LACTICIDVEN Microbiology No results found for this or any previous visit (from the past 240 hour(s)).  Time coordinating discharge: 32 minutes  SIGNED:  Latrelle Dodrill, MD  [facility] 08/26/2018, 10:27 AM

## 2018-08-26 NOTE — Progress Notes (Signed)
CARDIAC REHAB PHASE I   PRE:  Rate/Rhythm: 82 SR  BP:  Sitting: 123/86      SaO2: 97 RA  MODE:  Ambulation: 500 ft   POST:  Rate/Rhythm: 93 SR  BP:  Sitting: 148/81    SaO2: 98 RA   Pt ambulated 525ft in hallway independently with fast gait. Pt denies CP or SOB. Pt educated on importance of ASA, Brilinta, statin, and NTG. Stent card and MI book at bedside. Pt given heart healthy diet. Reviewed restrictions and exercise guidelines. Will refer to CRP [facility] [place], pt aware programs are currently closed but will follow-up once reopened.  [id number] Reynold Bowen, RN BSN 08/26/2018 9:10 AM

## 2018-08-26 NOTE — Progress Notes (Signed)
Unable to print AVS despite IT consult.  I went over AVS on computer screen with patient, and will mail paper copy when issue resolves.

## 2018-08-26 NOTE — Progress Notes (Signed)
Subjective:  No chest pain. Feels great.  Objective:  Vital Signs in the last 24 hours: Temp:  [98 F (36.7 C)-99.6 F (37.6 C)] 98.1 F (36.7 C) (03/21 0723) Pulse Rate:  [58-79] 65 (03/21 0725) Resp:  [10-28] 16 (03/21 0725) BP: (123-164)/(68-97) 127/77 (03/21 0725) SpO2:  [97 %-100 %] 99 % (03/21 0725) Weight:  [100.5 kg] 100.5 kg (03/21 0501)  Intake/Output from previous day: 03/20 0701 - 03/21 0700 In: 1096.6 [P.O.:480; I.V.:616.6] Out: 1675 [Urine:1675]  Physical Exam Constitutional: He is oriented to person, place, and time. He appears well-developed and well-nourished. No distress.  HENT:  Head: Normocephalic and atraumatic.  Eyes: Pupils are equal, round, and reactive to light. Conjunctivae are normal.  Neck: No JVD present.  Cardiovascular: Normal rate, regular rhythm and intact distal pulses.  No murmur heard. Pulmonary/Chest: Effort normal and breath sounds normal. He has no wheezes. He has no rales.  Abdominal: Soft. Bowel sounds are normal. There is no rebound.  Musculoskeletal:        General: No edema.  Lymphadenopathy:    He has no cervical adenopathy.  Neurological: He is alert and oriented to person, place, and time. No cranial nerve deficit.  Skin: Skin is warm and dry. Mild ecchymosis right wrist. No hematoma Psychiatric: He has a normal mood and affect.  Nursing note and vitals reviewed.   Lab Results: BMP Recent Labs    08/24/18 2000 08/25/18 0804 08/26/18 0423  NA 138 137 136  K 3.8 4.0 4.3  CL 106 106 107  CO2 24 23 20*  GLUCOSE 126* 106* 106*  BUN 25* 19 15  CREATININE 1.22 0.99 1.19  CALCIUM 8.7* 8.5* 8.8*  GFRNONAA >60 >60 >60  GFRAA >60 >60 >60    CBC Recent Labs  Lab 08/26/18 0423  WBC 7.3  RBC 4.17*  HGB 12.7*  HCT 36.9*  PLT 180  MCV 88.5  MCH 30.5  MCHC 34.4  RDW 12.9    HEMOGLOBIN A1C Lab Results  Component Value Date   HGBA1C 5.4 08/26/2018   MPG 108.28 08/26/2018    Cardiac Panel (last 3  results) Recent Labs    08/24/18 2300 08/25/18 0204  TROPONINI 0.24* 2.76*    BNP (last 3 results) No results for input(s): BNP in the last 8760 hours.  TSH No results for input(s): TSH in the last 8760 hours.  Lipid Panel     Component Value Date/Time   CHOL 205 (H) 08/25/2018 0804   TRIG 101 08/25/2018 0804   HDL 45 08/25/2018 0804   CHOLHDL 4.6 08/25/2018 0804   VLDL 20 08/25/2018 0804   LDLCALC 140 (H) 08/25/2018 0804     Hepatic Function Panel Recent Labs    08/25/18 0204 08/26/18 0423  PROT 6.7 6.3*  ALBUMIN 4.0 3.6  AST 54* 133*  ALT 35 62*  ALKPHOS 73 70  BILITOT 1.4* 2.0*  BILIDIR 0.3*  --   IBILI 1.1*  --      Cardiac Studies:  EKG 08/26/2018: Sinus rhythm. Age indeterminate posterior infarct.  No acute ischemic change  Echocardiogram 08/25/2018: LVEF 60-65%. Grade 1 DD Normal RV function RVSP 21 mmhg Mild LA dilatation Moderate aortic root dilatation (4.2 cm) Ascending aorta dilatation 4.5 cm  Cath 08/25/2018: LM: Normal LAD: Ostial-prox LAD 30% stenosis        Prox LAD 50% stenosis        Diag 1 ostial 30% stenosis LCx: Dominant vessel with prox-mid 20% stenosis  Large OM1 with 100% mid vessel occlusion. 50% ostial OM1 stenosis        Successful percutaneous coronary intervention         Aspiration thrombectomy, PTCA and stent placement        Synergy 3.0 X 20 mm drug-eluting stent mid OM1 RCA: Nondomiant vessel. No significant disease         DAPT for 1 year Aggressive medical management for residual mild to moderate disease  Assessment & Recommendations:  68 y.o. [place] male  with hypertension, admitted with NSTEMI  NSTEMI: Culprit vessel total occlusion of OM1. Successful percutaneous coronary intervention w/aspiration thrombectomy, PTCA and stent placement Synergy 3.0 X 20 mm drug-eluting stent mid OM1. Residual mild to moderate disease. Recommend medical management. ASA/Brilinta for 1 year. Pantoprazole  for gastric protection. Avoid NSAIDS. Lipitor 20 mg in view of mild liver enzyme elevation. Will need repeat LFT and lipid panel in 8-12 weeks Heart rate has now improved. Started metoprolol succinate 25 mg. Does not need lisinopril on discharge. SL NTG for as needed pain. All prescriptions sent. Patient is chest pain free with ambulation.  Mild aortic root dilatation: 4.2 cm, with ascending aorta up to 4.5 cm. Correlates with CTA. Likely upper normal for his body habitus. Will repeat outpatient echocardiogram in 1 year.   Hypertension: AS above  Hyperlipidemia: As above.  Ready for discharge from cardiac standpoint. Will follow up for transition of care-likely telephone visit in view of COVID-19 outbreak.  Elder Negus, M.D. 08/26/2018, 9:30 AM [facility] Pager: [phone] Office: [phone] If no answer: [phone]

## 2018-08-28 ENCOUNTER — Other Ambulatory Visit: Payer: Self-pay | Admitting: Internal Medicine

## 2018-08-28 ENCOUNTER — Telehealth: Payer: Self-pay

## 2018-08-28 ENCOUNTER — Encounter (HOSPITAL_COMMUNITY): Payer: Self-pay | Admitting: Cardiology

## 2018-08-28 MED ORDER — FAMOTIDINE 40 MG PO TABS: 40.0000 mg | ORAL_TABLET | Freq: Every day | ORAL | 3 refills | Status: DC

## 2018-08-28 MED FILL — Tirofiban HCl in NaCl 0.9% IV Soln 5 MG/100ML (Base Equiv): INTRAVENOUS | Qty: 100 | Status: AC

## 2018-08-28 NOTE — Telephone Encounter (Signed)
Pt is on TCM list after myocardial infarction.   LVM for pt to call back as soon as possible.  

## 2018-08-29 NOTE — Telephone Encounter (Signed)
Pt is on TCM list after myocardial infarction.   LVM for pt to call back as soon as possible.

## 2018-08-30 ENCOUNTER — Telehealth (HOSPITAL_COMMUNITY): Payer: Self-pay

## 2018-08-30 NOTE — Telephone Encounter (Signed)
Called patient to see if he is interested in the Cardiac Rehab Program. Patient expressed interest. Explained scheduling process and went over insurance, patient verbalized understanding. Adv pt he will need to contact Dr. Rosemary Holms office to schedule his follow up appt and that our department is closed for the next 4 weeks due to COVID-19 and we will contact him once we resume scheduling.

## 2018-08-30 NOTE — Telephone Encounter (Signed)
Pt insurance is active and benefits verified through [facility] Co-pay $10.00, DED $0.00/$0.00 met, out of pocket $3,400.00/$0.00 met, co-insurance 0%. No pre-authorization required. Passport, 08/30/2018 @ 1:45PM, REF# [id number]  Will contact patient to see if he is interested in the Cardiac Rehab Program. If interested, patient will need to complete follow up appt. Once completed, patient will be contacted for scheduling upon review by the RN Navigator.

## 2018-08-31 ENCOUNTER — Encounter

## 2018-08-31 ENCOUNTER — Ambulatory Visit (INDEPENDENT_AMBULATORY_CARE_PROVIDER_SITE_OTHER): Payer: Medicare HMO | Admitting: Internal Medicine

## 2018-08-31 ENCOUNTER — Encounter: Payer: Self-pay | Admitting: Internal Medicine

## 2018-08-31 DIAGNOSIS — I214 Non-ST elevation (NSTEMI) myocardial infarction: Secondary | ICD-10-CM

## 2018-08-31 DIAGNOSIS — I10 Essential (primary) hypertension: Secondary | ICD-10-CM

## 2018-08-31 DIAGNOSIS — K219 Gastro-esophageal reflux disease without esophagitis: Secondary | ICD-10-CM

## 2018-08-31 DIAGNOSIS — E785 Hyperlipidemia, unspecified: Secondary | ICD-10-CM | POA: Insufficient documentation

## 2018-08-31 MED ORDER — BETAMETHASONE DIPROPIONATE 0.05 % EX CREA: TOPICAL_CREAM | Freq: Two times a day (BID) | CUTANEOUS | 2 refills | Status: DC

## 2018-08-31 MED ORDER — PANTOPRAZOLE SODIUM 40 MG PO TBEC: 40.0000 mg | DELAYED_RELEASE_TABLET | Freq: Every day | ORAL | 3 refills | Status: DC

## 2018-08-31 MED ORDER — ATORVASTATIN CALCIUM 20 MG PO TABS: 20.0000 mg | ORAL_TABLET | Freq: Every day | ORAL | 3 refills | Status: DC

## 2018-08-31 MED ORDER — PANTOPRAZOLE SODIUM 40 MG PO TBEC: 40.0000 mg | DELAYED_RELEASE_TABLET | Freq: Every day | ORAL | 11 refills | Status: DC

## 2018-08-31 MED ORDER — METOPROLOL SUCCINATE ER 50 MG PO TB24: 50.0000 mg | ORAL_TABLET | Freq: Every day | ORAL | 3 refills | Status: DC

## 2018-08-31 MED ORDER — VITAMIN D3 50 MCG (2000 UT) PO CAPS: 2000.0000 [IU] | ORAL_CAPSULE | Freq: Every day | ORAL | 3 refills | Status: DC

## 2018-08-31 NOTE — Assessment & Plan Note (Signed)
Protonix daily 

## 2018-08-31 NOTE — Assessment & Plan Note (Addendum)
Suboptimal control  Toprol-XL was increased to 50 mg a day for better blood pressure control.  No added salt diet

## 2018-08-31 NOTE — Assessment & Plan Note (Signed)
On Brilinta, aspirin, Lipitor Toprol-XL dose was increased to 50 mg a day to improve blood pressure control Nitroglycerin as needed Status post PCI, found total occlusion of OM1.  PTCA and stent placement in mid OM1.  Mild to moderate disease recommending medical management.  Patient will be on aspirin/Brilinta for 1 year.  Lipitor 20 mg for now in view of mild LFT elevation.  Follow-up with cardiology

## 2018-08-31 NOTE — Assessment & Plan Note (Addendum)
Atorvastatin 20 mg daily labs pending to check lipids and liver tests History of slightly elevated LFTs.

## 2018-08-31 NOTE — Progress Notes (Signed)
Virtual Visit via Telephone Note  I connected with Alexander Hughes on 08/31/18 at  2:00 PM EDT by telephone and verified that I am speaking with the correct person using two identifiers.   I discussed the limitations, risks, security and privacy concerns of performing an evaluation and management service by telephone and the availability of in person appointments. I also discussed with the patient that there may be a patient responsible charge related to this service. The patient expressed understanding and agreed to proceed.   History of Present Illness: Brief/Interim Summary:  This is a posthospitalization virtual visit.  The patient was recently hospitalized for non-STEMI MI.  He is currently on Brilinta and aspirin.  There is no chest pain.  His systolic blood pressure at home varies between 140-150.  His pulse is around 70 bpm. I reviewed his hospital stay records  For full details see H&P/Progress note, but in brief, Alexander Hughes is a 68 year old male with past medical history of essential hypertension who has not been compliant with medication for the past 2 years presented to the emergency department complaining of chest pain.  Patient was admitted for evaluation of ACS.  Troponin I 0.76, EKG early R wave transition suspicious for posterior infarct.  No ST depression.  Echocardiogram showed EF of 60 to 65%, normal diastolic function.  Moderate dilation of the aortic root and mild dilation of the ascending aorta.  Cardiology was consulted and patient underwent PCI, total occlusion of OM1 was found. Intervention aspiration, thrombectomy and stent placement was performed.  Patient has been pain-free, cardiology recommended medical treatment and deemed stable for discharge.  Patient was placed on aspirin and Brilinta for 1 year, pantoprazole for gastric protection and Lipitor.  He was also started on metoprolol succinate 25 mg.  Subjective: Patient seen and examined, he has no complaints  today.  Denies chest pain, shortness of breath.  He is ambulating with no chest pain.  Tolerating diet well.  No signs of bleeding.  He is afebrile.  Discharge Diagnoses/Hospital Course:  NSTEMI Status post PCI, found total occlusion of OM1.  PTCA and stent placement in mid OM1.  Mild to moderate disease recommending medical management.  Patient will be on aspirin/Brilinta for 1 year.  Lipitor 20 mg for now in view of mild LFT elevation.  Started on metoprolol succinate 25 mg.  Per cardiology no need for lisinopril at this time.  Recommend Protonix for gastric protection. Follow-up with cardiology   Hypertension BP stable upon discharge, continue metoprolol succinate 25 mg daily.  Low-salt diet encouraged.  Follow-up with PCP.  Mild aortic root dilation 4.2 cm with ascending aorta up to 4.5 cm.  Outpatient follow-up recommended.  Hyperlipidemia Started on Lipitor 20 mg nightly.  Monitor liver enzymes.  Low-fat diet recommended, outpatient surveillance recommended in 8 to 12-week..  Mild elevated LFTs Unclear etiology at this time, repeat LFTs in 4 to 6 weeks.  Follow-up with PCP.  Normocytic anemia Globin 12.7 upon discharge, no signs of overt bleeding.  Outpatient evaluation recommended.  Right pulmonary nodules Incidental finding, CT surveillance in 6 months to 1 year.  On the day of the discharge the patient's vitals were stable, and no other acute medical condition were reported by patient. the patient was felt safe to be discharge to home  Discharge Instructions  You were cared for by a hospitalist during your hospital stay. If you have any questions about your discharge medications or the care you received while you were in the hospital  after you are discharged, you can call the unit and asked to speak with the hospitalist on call if the hospitalist that took care of you is not available. Once you are discharged, your primary care physician will handle any further medical  issues. Please note that NO REFILLS for any discharge medications will be authorized once you are discharged, as it is imperative that you return to your primary care physician (or establish a relationship with a primary care physician if you do not have one) for your aftercare needs so that they can reassess your need for medications and monitor your lab values.    Observations/Objective:  He looks well.  He is no acute distress. Assessment and Plan:  See plan section Follow Up Instructions:    I discussed the assessment and treatment plan with the patient. The patient was provided an opportunity to ask questions and all were answered. The patient agreed with the plan and demonstrated an understanding of the instructions.   The patient was advised to call back or seek an in-person evaluation if the symptoms worsen or if the condition fails to improve as anticipated.  I provided >20 minutes of non-face-to-face time during this encounter.   Sonda Primes, MD

## 2018-09-04 ENCOUNTER — Telehealth: Payer: Self-pay

## 2018-09-04 NOTE — Telephone Encounter (Signed)
error 

## 2018-09-04 NOTE — Telephone Encounter (Signed)
-----   Message from Elder Negus, MD sent at 08/26/2018  9:28 AM EDT ----- Regarding: [facility] Discharge follow up: TOC: Needed Follow up appt: Needed (Will likely be telephone visit) Discharge diagnosis: NSTEMI s/p PCI Discharge date: 08/26/2018  Thanks MJP

## 2018-09-05 NOTE — Progress Notes (Signed)
Virtual Visit via Video Note   Subjective:   Alexander Hughes, male    DOB: Sep 02, 1950, 68 y.o.   MRN: [id number]   I connected with the patient on 09/05/18 by a video enabled telemedicine application and verified that I am speaking with the correct person using two identifiers.     I discussed the limitations of evaluation and management by telemedicine and the availability of in person appointments. The patient expressed understanding and agreed to proceed.   This visit type was conducted due to national recommendations for restrictions regarding the COVID-19 Pandemic (e.g. social distancing).  This format is felt to be most appropriate for this patient at this time.  All issues noted in this document were discussed and addressed.  No physical exam was performed (except for noted visual exam findings with Tele health visits).  The patient has consented to conduct a Tele health visit and understands insurance will be billed.     Chief complaint:  Coronary artery disease  HPI  72 y/oRussian Americanmalewith hypertension,CAD with NSTEMI 08/2019 treated with PCI, elevated liver enzymes.  Patient was admitted to [facility] on 08/25/2018 with complaints of chest pain.  He was diagnosed with non-STEMI, with troponin elevation of 2.4 NG/mL.  Coronary angiogram showed complete occlusion of large OM1 vessel, for which he successfully underwent aspiration thrombectomy, PTCA and stent placement Synergy 3.0 X 20 mm drug-eluting stent mid OM1.  He has residual mild to moderate disease in other vessels.  He was discharged on dual antiplatelet therapy with aspirin and Brilinta, pantoprazole for gastric protection, lipitor 20 mg daily, metoprolol succinate 25 mg daily.  He was noted to have elevated liver enzymes during the hospital stay, without any other clinical signs of liver failure.  He was also incidentally noted to have dilated ascending aorta up to 4.5 cm, seen both on echocardiogram  as well as CT angiogram (performed in the ED suspecting pulmonary embolism).  Of note, patient did not have PE.  Patient is on virtual visit today with his daughter, who helped with language interpretation. He has not had any chest pain or shortness of breath since his hospital discharge. He has walked couple of miles without any difficulty. He is compliant with his medical therapy. He had a virtual visit with his PCP, who recommended him to increase metoprolol to 0 mg daily. Patient wanted to discuss this today with me before increasing the dose.   He has not had any complaints of abdominal pain, nausea, vomiting. AS noted, LFT's and lipase were elevated during his hospital stay, even before starting statin.   Past Medical History:  Diagnosis Date   CHF (congestive heart failure) (HCC)      Past Surgical History:  Procedure Laterality Date   CORONARY STENT INTERVENTION N/A 08/25/2018   Procedure: CORONARY STENT INTERVENTION;  Surgeon: Elder Negus, MD;  Location: [facility] INVASIVE CV LAB;  Service: Cardiovascular;  Laterality: N/A;   CORONARY THROMBECTOMY N/A 08/25/2018   Procedure: Coronary Thrombectomy;  Surgeon: Elder Negus, MD;  Location: [facility] INVASIVE CV LAB;  Service: Cardiovascular;  Laterality: N/A;   LEFT HEART CATH AND CORONARY ANGIOGRAPHY N/A 08/25/2018   Procedure: LEFT HEART CATH AND CORONARY ANGIOGRAPHY;  Surgeon: Elder Negus, MD;  Location: [facility] INVASIVE CV LAB;  Service: Cardiovascular;  Laterality: N/A;   right hand surgery       Social History   Socioeconomic History   Marital status: Married    Spouse name: Not on file  Number of children: 1   Years of education: Not on file   Highest education level: Not on file  Occupational History   Not on file  Social Needs   Financial resource strain: Not on file   Food insecurity:    Worry: Not on file    Inability: Not on file   Transportation needs:    Medical: Not on file     Non-medical: Not on file  Tobacco Use   Smoking status: Never Smoker   Smokeless tobacco: Never Used  Substance and Sexual Activity   Alcohol use: Yes    Comment: occ   Drug use: No   Sexual activity: Yes  Lifestyle   Physical activity:    Days per week: Not on file    Minutes per session: Not on file   Stress: Not on file  Relationships   Social connections:    Talks on phone: Not on file    Gets together: Not on file    Attends religious service: Not on file    Active member of club or organization: Not on file    Attends meetings of clubs or organizations: Not on file    Relationship status: Not on file   Intimate partner violence:    Fear of current or ex partner: Not on file    Emotionally abused: Not on file    Physically abused: Not on file    Forced sexual activity: Not on file  Other Topics Concern   Not on file  Social History Narrative   Not on file     Current Outpatient Medications on File Prior to Visit  Medication Sig Dispense Refill   acetaminophen (TYLENOL) 325 MG tablet Take 2 tablets (650 mg total) by mouth every 4 (four) hours as needed for headache or mild pain.     aspirin EC 81 MG EC tablet Take 1 tablet (81 mg total) by mouth daily. 60 tablet 3   atorvastatin (LIPITOR) 20 MG tablet Take 1 tablet (20 mg total) by mouth daily at 6 PM. 90 tablet 3   betamethasone dipropionate (DIPROLENE) 0.05 % cream Apply topically 2 (two) times daily. 45 g 2   Cholecalciferol (VITAMIN D3) 50 MCG (2000 UT) capsule Take 1 capsule (2,000 Units total) by mouth daily. 100 capsule 3   metoprolol succinate (TOPROL XL) 50 MG 24 hr tablet Take 1 tablet (50 mg total) by mouth daily. Take with or immediately following a meal. 90 tablet 3   nitroGLYCERIN (NITROSTAT) 0.4 MG SL tablet Place 1 tablet (0.4 mg total) under the tongue every 5 (five) minutes as needed for chest pain. 30 tablet 3   pantoprazole (PROTONIX) 40 MG tablet Take 1 tablet (40 mg total) by  mouth daily. 90 tablet 3   No current facility-administered medications on file prior to visit.     Cardiac Studies:  EKG 08/26/2018: Sinus rhythm. Age indeterminate posterior infarct.  No acute ischemic change  Echocardiogram 08/25/2018: LVEF 60-65%. Grade 1 DD Normal RV function RVSP 21 mmhg Mild LA dilatation Moderate aortic root dilatation (4.2 cm) Ascending aorta dilatation 4.5 cm  Cath 08/25/2018: LM: Normal LAD: Ostial-prox LAD 30% stenosis Prox LAD 50% stenosis Diag 1 ostial 30% stenosis LCx: Dominant vessel with prox-mid 20% stenosis Large OM1 with 100% mid vessel occlusion. 50% ostial OM1 stenosis Successful percutaneous coronary intervention  Aspiration thrombectomy, PTCA and stent placement Synergy 3.0 X 20 mm drug-eluting stent mid OM1 RCA: Nondomiant vessel. No significant disease  DAPT for  1 year Aggressive medical management for residual mild to moderate disease  Recent labs: Results for [name], Justine (MRN [id number]) as of 09/06/2018 13:33  Ref. Range 08/24/2018 23:00 08/25/2018 02:04 08/25/2018 08:04 08/26/2018 04:23  BASIC METABOLIC PANEL Unknown   Rpt (A)   COMPREHENSIVE METABOLIC PANEL Unknown    Rpt (A)  Sodium Latest Ref Range: 135 - 145 mmol/L   137 136  Potassium Latest Ref Range: 3.5 - 5.1 mmol/L   4.0 4.3  Chloride Latest Ref Range: 98 - 111 mmol/L   106 107  CO2 Latest Ref Range: 22 - 32 mmol/L   23 20 (L)  Glucose Latest Ref Range: 70 - 99 mg/dL   409106 (H) 811106 (H)  Mean Plasma Glucose Latest Units: mg/dL    [phone]  BUN Latest Ref Range: 8 - 23 mg/dL   19 15  Creatinine Latest Ref Range: 0.61 - 1.24 mg/dL   2.950.99 6.211.19  Calcium Latest Ref Range: 8.9 - 10.3 mg/dL   8.5 (L) 8.8 (L)  Anion gap Latest Ref Range: 5 - 15    8 9   Alkaline Phosphatase Latest Ref Range: 38 - 126 U/L  73  70  Albumin Latest Ref Range: 3.5 - 5.0 g/dL  4.0  3.6  Lipase Latest Ref Range: 11 - 51 U/L  59 (H)  58 (H)  AST Latest  Ref Range: 15 - 41 U/L  54 (H)  133 (H)  ALT Latest Ref Range: 0 - 44 U/L  35  62 (H)  Total Protein Latest Ref Range: 6.5 - 8.1 g/dL  6.7  6.3 (L)  Bilirubin, Direct Latest Ref Range: 0.0 - 0.2 mg/dL  0.3 (H)    Indirect Bilirubin Latest Ref Range: 0.3 - 0.9 mg/dL  1.1 (H)    Total Bilirubin Latest Ref Range: 0.3 - 1.2 mg/dL  1.4 (H)  2.0 (H)  GFR, Est Non African American Latest Ref Range: >60 mL/min   >60 >60  GFR, Est African American Latest Ref Range: >60 mL/min   >60 >60  Troponin I Latest Ref Range: <0.03 ng/mL 0.24 (HH) 2.76 (HH)    Total CHOL/HDL Ratio Latest Units: RATIO   4.6   Cholesterol Latest Ref Range: 0 - 200 mg/dL   308205 (H)   HDL Cholesterol Latest Ref Range: >40 mg/dL   45   LDL (calc) Latest Ref Range: 0 - 99 mg/dL   657140 (H)   Triglycerides Latest Ref Range: <150 mg/dL   846101   VLDL Latest Ref Range: 0 - 40 mg/dL   20     Review of Systems  Constitution: Negative for decreased appetite, malaise/fatigue, weight gain and weight loss.  HENT: Negative for congestion.   Eyes: Negative for visual disturbance.  Cardiovascular: Negative for chest pain, claudication, dyspnea on exertion, leg swelling, palpitations and syncope.  Respiratory: Negative for shortness of breath.   Endocrine: Negative for cold intolerance.  Hematologic/Lymphatic: Does not bruise/bleed easily.  Skin: Negative for itching and rash.  Musculoskeletal: Negative for myalgias.  Gastrointestinal: Negative for abdominal pain, nausea and vomiting.  Genitourinary: Negative for dysuria.  Neurological: Negative for dizziness and weakness.  Psychiatric/Behavioral: The patient is not nervous/anxious.   All other systems reviewed and are negative.        Vitals:   09/06/18 0936  BP: 140/87  Pulse: (!) 57   (Measured by the patient using a home BP monitor)   Observation/findings during video visit   Objective:   Physical Exam  Constitutional:  He is oriented to person, place, and time. He appears  well-developed and well-nourished. No distress.  HENT:  Head: Atraumatic.  Neck: No JVD present.  Cardiovascular:  Right radial site with no bleeding, hematoma. Good capillary refill.    Pulmonary/Chest: Effort normal.  Abdominal: Soft. Bowel sounds are normal. There is no rebound.  Musculoskeletal:        General: No edema.  Neurological: He is alert and oriented to person, place, and time.  Psychiatric: He has a normal mood and affect.  Nursing note and vitals reviewed.         Assessment & Recommendations:   38 y/oRussian Americanmalewith hypertension,CAD with NSTEMI 08/2019 treated with PCI, elevated liver enzymes.  Coronary artery disease without angina: Non-STEMI 08/2018.  Successful aspiration thrombectomy and stent placement to large OM1 100% occlusion.  Synergy 3.0 x 20 mm DES. Mild to moderate residual coronary artery disease. Preserved LVEF with no significant wall motion abnormality on echocardiogram. Continue dual antiplatelet therapy with aspirin and Brilinta till 08/2019. Okay to increase metoprolol succinate to 50 mg daily. HR in 50s today, but 60s-70s at baseline.  While he is currently on Lipitor 20 mg, he will need close monitoring of liver enzymes.  I will obtain liver enzymes next week. If continue be elevated, he may need further workup regarding this. Virtual visit with GI could be an option. I will touch base with his PCP.   Dilated ascending aorta: As both echocardiogram and CT angiogram correlate with each other, recommend repeat echocardiogram in March 2021, ordered.  I will see him in 6 months.   Elder Negus, MD [facility] [place] Pager: [phone] Office: [phone] If no answer Cell [phone]

## 2018-09-06 ENCOUNTER — Ambulatory Visit (INDEPENDENT_AMBULATORY_CARE_PROVIDER_SITE_OTHER): Payer: Medicare HMO | Admitting: Cardiology

## 2018-09-06 ENCOUNTER — Other Ambulatory Visit: Payer: Self-pay

## 2018-09-06 ENCOUNTER — Encounter: Payer: Self-pay | Admitting: Cardiology

## 2018-09-06 VITALS — BP 140/87 | HR 57 | Ht 74.0 in | Wt 223.0 lb

## 2018-09-06 DIAGNOSIS — I252 Old myocardial infarction: Secondary | ICD-10-CM

## 2018-09-06 DIAGNOSIS — R748 Abnormal levels of other serum enzymes: Secondary | ICD-10-CM | POA: Diagnosis not present

## 2018-09-06 DIAGNOSIS — I2510 Atherosclerotic heart disease of native coronary artery without angina pectoris: Secondary | ICD-10-CM

## 2018-09-06 DIAGNOSIS — I214 Non-ST elevation (NSTEMI) myocardial infarction: Secondary | ICD-10-CM | POA: Insufficient documentation

## 2018-09-06 HISTORY — DX: Abnormal levels of other serum enzymes: R74.8

## 2018-09-06 MED ORDER — TICAGRELOR 90 MG PO TABS: 90.0000 mg | ORAL_TABLET | Freq: Two times a day (BID) | ORAL | 3 refills | Status: DC

## 2018-09-11 ENCOUNTER — Ambulatory Visit: Payer: Medicare Other | Admitting: Internal Medicine

## 2018-09-11 DIAGNOSIS — R748 Abnormal levels of other serum enzymes: Secondary | ICD-10-CM | POA: Diagnosis not present

## 2018-09-12 ENCOUNTER — Telehealth (HOSPITAL_COMMUNITY): Payer: Self-pay | Admitting: *Deleted

## 2018-09-12 LAB — HEPATIC FUNCTION PANEL
ALT: 31 IU/L (ref 0–44)
AST: 21 IU/L (ref 0–40)
Albumin: 4.4 g/dL (ref 3.8–4.8)
Alkaline Phosphatase: 92 IU/L (ref 39–117)
Bilirubin Total: 0.7 mg/dL (ref 0.0–1.2)
Bilirubin, Direct: 0.2 mg/dL (ref 0.00–0.40)
Total Protein: 6.9 g/dL (ref 6.0–8.5)

## 2018-09-12 LAB — LIPASE: Lipase: 89 U/L — ABNORMAL HIGH (ref 13–78)

## 2018-09-12 NOTE — Telephone Encounter (Signed)
Unable to reach pt by phone. Called pt daughter to relay message regarding referral for cardiac rehab.  Once we are permitted to schedule, he will be contacted.  Verbalized understanding.  Alanson Aly, BSN Cardiac and Emergency planning/management officer

## 2018-09-13 ENCOUNTER — Other Ambulatory Visit: Payer: Self-pay | Admitting: Internal Medicine

## 2018-09-13 DIAGNOSIS — E785 Hyperlipidemia, unspecified: Secondary | ICD-10-CM

## 2018-09-20 NOTE — Telephone Encounter (Signed)
Please respond

## 2018-10-02 ENCOUNTER — Other Ambulatory Visit (INDEPENDENT_AMBULATORY_CARE_PROVIDER_SITE_OTHER): Payer: Medicare HMO

## 2018-10-02 DIAGNOSIS — E785 Hyperlipidemia, unspecified: Secondary | ICD-10-CM | POA: Diagnosis not present

## 2018-10-02 LAB — LIPASE: Lipase: 46 U/L (ref 11.0–59.0)

## 2018-10-02 LAB — BASIC METABOLIC PANEL
BUN: 20 mg/dL (ref 6–23)
CO2: 22 mEq/L (ref 19–32)
Calcium: 8.9 mg/dL (ref 8.4–10.5)
Chloride: 106 mEq/L (ref 96–112)
Creatinine, Ser: 1.11 mg/dL (ref 0.40–1.50)
GFR: 65.6 mL/min (ref >60.00)
Glucose, Bld: 95 mg/dL (ref 70–99)
Potassium: 4.1 mEq/L (ref 3.5–5.1)
Sodium: 140 mEq/L (ref 135–145)

## 2018-10-02 LAB — HEPATIC FUNCTION PANEL
ALT: 25 U/L (ref 0–53)
AST: 21 U/L (ref 0–37)
Albumin: 4.4 g/dL (ref 3.5–5.2)
Alkaline Phosphatase: 80 U/L (ref 39–117)
Bilirubin, Direct: 0.2 mg/dL (ref 0.0–0.3)
Total Bilirubin: 1 mg/dL (ref 0.2–1.2)
Total Protein: 7 g/dL (ref 6.0–8.3)

## 2018-10-03 ENCOUNTER — Ambulatory Visit (INDEPENDENT_AMBULATORY_CARE_PROVIDER_SITE_OTHER): Payer: Medicare HMO | Admitting: Internal Medicine

## 2018-10-03 ENCOUNTER — Encounter: Payer: Self-pay | Admitting: Internal Medicine

## 2018-10-03 ENCOUNTER — Other Ambulatory Visit: Payer: Self-pay

## 2018-10-03 DIAGNOSIS — T148XXA Other injury of unspecified body region, initial encounter: Secondary | ICD-10-CM

## 2018-10-03 DIAGNOSIS — I2510 Atherosclerotic heart disease of native coronary artery without angina pectoris: Secondary | ICD-10-CM | POA: Diagnosis not present

## 2018-10-03 NOTE — Assessment & Plan Note (Signed)
likely due to Brilinta and aspirin combination Vitamin C 500 mg daily I will check with cardiology what we can do with respect to his Brilinta and aspirin management

## 2018-10-03 NOTE — Assessment & Plan Note (Signed)
Doing well on current regimen; no chest pain

## 2018-10-03 NOTE — Telephone Encounter (Signed)
FROM PT

## 2018-10-03 NOTE — Progress Notes (Signed)
Virtual Visit via Video Note  I connected with Alexander Hughes on 10/03/18 at  4:00 PM EDT by a video enabled telemedicine application and verified that I am speaking with the correct person using two identifiers.   I discussed the limitations of evaluation and management by telemedicine and the availability of in person appointments. The patient expressed understanding and agreed to proceed.  History of Present Illness:   The patient is complaining of easy bruising and substantial bleeding from mosquito bites, cuts etc. There is no bleeding gums, blood in the urine, blood in stool.  No chest pain or shortness of breath Observations/Objective:  She is in no acute distress.  Looks well.  He is showing me bruises on his abdomen and legs.  Mosquito bites are with dried blood crust on the upper arms Assessment and Plan: See plan  Follow Up Instructions:    I discussed the assessment and treatment plan with the patient. The patient was provided an opportunity to ask questions and all were answered. The patient agreed with the plan and demonstrated an understanding of the instructions.   The patient was advised to call back or seek an in-person evaluation if the symptoms worsen or if the condition fails to improve as anticipated.  I provided 15 minutes of non-face-to-face time during this encounter.   Sonda Primes, MD

## 2018-10-03 NOTE — Telephone Encounter (Signed)
Pt set up virtual visit

## 2018-10-05 ENCOUNTER — Telehealth (HOSPITAL_COMMUNITY): Payer: Self-pay

## 2018-11-22 ENCOUNTER — Telehealth (HOSPITAL_COMMUNITY): Payer: Self-pay

## 2018-11-22 NOTE — Telephone Encounter (Signed)
Called and spoke with pt in regards to our Virtual Cardiac Rehab, pt stated he would like to wait until our in gym Cardiac Rehab opens back up. °

## 2018-12-04 ENCOUNTER — Telehealth (HOSPITAL_COMMUNITY): Payer: Self-pay

## 2018-12-04 NOTE — Telephone Encounter (Signed)
Pt insurance is active and benefits verified through [employer]. Co-pay $0.00, DED $0.00/$0.00 met, out of pocket $3,400.00/$340.00 met, co-insurance 0%. No pre-authorization required. [employer], 12/04/2018 @ 8:59AM, REF#[id number] °

## 2018-12-13 ENCOUNTER — Telehealth (HOSPITAL_COMMUNITY): Payer: Self-pay

## 2019-02-27 DIAGNOSIS — K0889 Other specified disorders of teeth and supporting structures: Secondary | ICD-10-CM | POA: Diagnosis not present

## 2019-03-01 ENCOUNTER — Ambulatory Visit: Payer: Self-pay

## 2019-03-01 NOTE — Telephone Encounter (Signed)
Please advise 

## 2019-03-01 NOTE — Telephone Encounter (Signed)
Daughter, Gregary Signs, calling for pt. To interpret . Pt. Had an abscessed tooth pulled this morning and is soaking the gauze every 20 minutes. Having a lot of pain, 9/10. Has ice pack to left side of face. Reports dentist was supposed to call Dr. Alain Marion about what he could have for pain. The pt. Still has not heard from dentist. Concerned about bleeding and pain. Please call Gregary Signs back. Number [phone]. Spoke with Gareth Eagle in the practice.  Answer Assessment - Initial Assessment Questions 1. SYMPTOM: "What's the main symptom you're concerned about?" (e.g., bleeding, pain, fever)     Bleeding - changing gauze ever 20 minutes 2. ONSET: "When did the bleeding start?"     This morning 0900 3. DATE of TOOTH EXTRACTION: "When was surgery performed?"      Today 4. BLEEDING: "Is there any bleeding?" If so, ask: "How much?"      Yes 5. PAIN: "Is there any pain?" If so, ask: "How bad is it?"  (Scale 1-10; or mild, moderate, severe)     9 6. FEVER: "Do you have a fever?" If so, ask: "What is your temperature, how was it measured, and when did it start?"     No 7. OTHER SYMPTOMS: "Do you have any other symptoms?" (e.g., face swelling)     Left side of face - molar  Protocols used: TOOTH EXTRACTION-A-AH

## 2019-03-02 NOTE — Telephone Encounter (Signed)
Go to the dental office now. Bite on the gauze. Ice. Take 1000 mg Tylenol Thx

## 2019-03-02 NOTE — Telephone Encounter (Signed)
Daughter notified 

## 2019-03-08 ENCOUNTER — Ambulatory Visit: Payer: Medicare HMO | Admitting: Cardiology

## 2019-03-08 ENCOUNTER — Encounter: Payer: Self-pay | Admitting: Cardiology

## 2019-03-08 ENCOUNTER — Other Ambulatory Visit: Payer: Self-pay

## 2019-03-08 VITALS — BP 131/82 | HR 66 | Temp 98.0°F | Ht 73.0 in | Wt 219.0 lb

## 2019-03-08 DIAGNOSIS — I2510 Atherosclerotic heart disease of native coronary artery without angina pectoris: Secondary | ICD-10-CM | POA: Diagnosis not present

## 2019-03-08 DIAGNOSIS — E782 Mixed hyperlipidemia: Secondary | ICD-10-CM | POA: Diagnosis not present

## 2019-03-08 DIAGNOSIS — I77810 Thoracic aortic ectasia: Secondary | ICD-10-CM | POA: Diagnosis not present

## 2019-03-08 DIAGNOSIS — I7789 Other specified disorders of arteries and arterioles: Secondary | ICD-10-CM | POA: Insufficient documentation

## 2019-03-08 DIAGNOSIS — I509 Heart failure, unspecified: Secondary | ICD-10-CM | POA: Diagnosis not present

## 2019-03-08 NOTE — Progress Notes (Signed)
Subjective:   Alexander Hughes, male    DOB: [birth year], 68 y.o.   MRN: [id number]    Chief complaint:  Coronary artery disease  HPI  57 y/oRussian Americanmalewith hypertension,CAD with NSTEMI 08/2019-OM1 PCI, dilated ascending aorta 4.5 cm.  Patient is here today with his daughter. Interpretor was offered, but patient preferred his daughter to interpret for him. He has been doing well and denies chest pain, shortness of breath, palpitations, leg edema, orthopnea, PND, TIA/syncope. He endorses easy bruising and bleeding. He had a dental abscess treated while on Briinta, after which he had a lot of bleeding. However, he denies any hematemesis, melena. He is going to have another tooth extracted in next few weeks.   Past Medical History:  Diagnosis Date  . CHF (congestive heart failure) (HCC)      Past Surgical History:  Procedure Laterality Date  . CORONARY STENT INTERVENTION N/A 08/25/2018   Procedure: CORONARY STENT INTERVENTION;  Surgeon: Elder Negus, MD;  Location: [facility];  Service: Cardiovascular;  Laterality: N/A;  . CORONARY THROMBECTOMY N/A 08/25/2018   Procedure: Coronary Thrombectomy;  Surgeon: Elder Negus, MD;  Location: [facility];  Service: Cardiovascular;  Laterality: N/A;  . LEFT HEART CATH AND CORONARY ANGIOGRAPHY N/A 08/25/2018   Procedure: LEFT HEART CATH AND CORONARY ANGIOGRAPHY;  Surgeon: Elder Negus, MD;  Location: [facility];  Service: Cardiovascular;  Laterality: N/A;  . right hand surgery       Social History   Socioeconomic History  . Marital status: Married    Spouse name: Not on file  . Number of children: 1  . Years of education: Not on file  . Highest education level: Not on file  Occupational History  . Not on file  Social Needs  . Financial resource strain: Not on file  . Food insecurity    Worry: Not on file    Inability: Not on file  . Transportation needs    Medical: Not on  file    Non-medical: Not on file  Tobacco Use  . Smoking status: Never Smoker  . Smokeless tobacco: Never Used  Substance and Sexual Activity  . Alcohol use: Yes    Comment: occ  . Drug use: No  . Sexual activity: Yes  Lifestyle  . Physical activity    Days per week: Not on file    Minutes per session: Not on file  . Stress: Not on file  Relationships  . Social Musician on phone: Not on file    Gets together: Not on file    Attends religious service: Not on file    Active member of club or organization: Not on file    Attends meetings of clubs or organizations: Not on file    Relationship status: Not on file  . Intimate partner violence    Fear of current or ex partner: Not on file    Emotionally abused: Not on file    Physically abused: Not on file    Forced sexual activity: Not on file  Other Topics Concern  . Not on file  Social History Narrative  . Not on file     Current Outpatient Medications on File Prior to Visit  Medication Sig Dispense Refill  . acetaminophen (TYLENOL) 325 MG tablet Take 2 tablets (650 mg total) by mouth every 4 (four) hours as needed for headache or mild pain.    Marland Kitchen aspirin EC 81 MG  EC tablet Take 1 tablet (81 mg total) by mouth daily. 60 tablet 3  . atorvastatin (LIPITOR) 20 MG tablet Take 1 tablet (20 mg total) by mouth daily at 6 PM. 90 tablet 3  . betamethasone dipropionate (DIPROLENE) 0.05 % cream Apply topically 2 (two) times daily. 45 g 2  . Cholecalciferol (VITAMIN D3) 50 MCG (2000 UT) capsule Take 1 capsule (2,000 Units total) by mouth daily. 100 capsule 3  . metoprolol succinate (TOPROL XL) 50 MG 24 hr tablet Take 1 tablet (50 mg total) by mouth daily. Take with or immediately following a meal. 90 tablet 3  . nitroGLYCERIN (NITROSTAT) 0.4 MG SL tablet Place 1 tablet (0.4 mg total) under the tongue every 5 (five) minutes as needed for chest pain. 30 tablet 3  . pantoprazole (PROTONIX) 40 MG tablet Take 1 tablet (40 mg total)  by mouth daily. 90 tablet 3  . ticagrelor (BRILINTA) 90 MG TABS tablet Take 1 tablet (90 mg total) by mouth 2 (two) times daily. 120 tablet 3   No current facility-administered medications on file prior to visit.     Cardiac Studies:  EKG 03/08/2019: Sinus rhythm 62 bpm. Early R wave transition. Unchanged from pripr EKG.  Coronary angiography and intervention 08/25/2018: LM: Normal LAD: Ostial-prox LAD 30% stenosis        Prox LAD 50% stenosis        Diag 1 ostial 30% stenosis LCx: Dominant vessel with prox-mid 20% stenosis        Large OM1 with 100% mid vessel occlusion. 50% ostial OM1 stenosis        Successful percutaneous coronary intervention         Aspiration thrombectomy, PTCA and stent placement        Synergy 3.0 X 20 mm drug-eluting stent mid OM1 RCA: Nondomiant vessel. No significant disease         DAPT for 1 year Aggressive medical management for residual mild to moderate disease  EKG 08/26/2018: Sinus rhythm. Age indeterminate posterior infarct.  No acute ischemic change  Echocardiogram 08/25/2018: LVEF 60-65%. Grade 1 DD Normal RV function RVSP 21 mmhg Mild LA dilatation Moderate aortic root dilatation (4.2 cm) Ascending aorta dilatation 4.5 cm  Cath 08/25/2018: LM: Normal LAD: Ostial-prox LAD 30% stenosis Prox LAD 50% stenosis Diag 1 ostial 30% stenosis LCx: Dominant vessel with prox-mid 20% stenosis Large OM1 with 100% mid vessel occlusion. 50% ostial OM1 stenosis Successful percutaneous coronary intervention  Aspiration thrombectomy, PTCA and stent placement Synergy 3.0 X 20 mm drug-eluting stent mid OM1 RCA: Nondomiant vessel. No significant disease  DAPT for 1 year Aggressive medical management for residual mild to moderate disease  Recent labs:  Results for Alexander Hughes, Alexander Hughes (MRN [id number]) as of 03/08/2019 10:48  Ref. Range 08/25/2018 08:04  Total CHOL/HDL Ratio Latest Units: RATIO 4.6   Cholesterol Latest Ref Range: 0 - 200 mg/dL 409205 (H)  HDL Cholesterol Latest Ref Range: >40 mg/dL 45  LDL (calc) Latest Ref Range: 0 - 99 mg/dL 811140 (H)  Triglycerides Latest Ref Range: <150 mg/dL 914101  VLDL Latest Ref Range: 0 - 40 mg/dL 20    Results for Alexander Hughes, Alexander Hughes (MRN [id number]) as of 03/08/2019 05:29  Ref. Range 10/02/2018 08:14  Sodium Latest Ref Range: 135 - 145 mEq/L 140  Potassium Latest Ref Range: 3.5 - 5.1 mEq/L 4.1  Chloride Latest Ref Range: 96 - 112 mEq/L 106  CO2 Latest Ref Range: 19 - 32 mEq/L 22  Glucose Latest Ref Range: 70 -  99 mg/dL 95  BUN Latest Ref Range: 6 - 23 mg/dL 20  Creatinine Latest Ref Range: 0.40 - 1.50 mg/dL 1.11  Calcium Latest Ref Range: 8.4 - 10.5 mg/dL 8.9  Alkaline Phosphatase Latest Ref Range: 39 - 117 U/L 80  Albumin Latest Ref Range: 3.5 - 5.2 g/dL 4.4  Lipase Latest Ref Range: 11.0 - 59.0 U/L 46.0  AST Latest Ref Range: 0 - 37 U/L 21  ALT Latest Ref Range: 0 - 53 U/L 25  Total Protein Latest Ref Range: 6.0 - 8.3 g/dL 7.0  Bilirubin, Direct Latest Ref Range: 0.0 - 0.3 mg/dL 0.2  Total Bilirubin Latest Ref Range: 0.2 - 1.2 mg/dL 1.0  GFR Latest Ref Range: >60.00 mL/min 65.60   Results for ISAACK, PREBLE (MRN [id number]) as of 03/08/2019 05:29  Ref. Range 08/26/2018 04:23  WBC Latest Ref Range: 4.0 - 10.5 K/uL 7.3  RBC Latest Ref Range: 4.22 - 5.81 MIL/uL 4.17 (L)  Hemoglobin Latest Ref Range: 13.0 - 17.0 g/dL 12.7 (L)  HCT Latest Ref Range: 39.0 - 52.0 % 36.9 (L)  MCV Latest Ref Range: 80.0 - 100.0 fL 88.5  MCH Latest Ref Range: 26.0 - 34.0 pg 30.5  MCHC Latest Ref Range: 30.0 - 36.0 g/dL 34.4  RDW Latest Ref Range: 11.5 - 15.5 % 12.9  Platelets Latest Ref Range: 150 - 400 K/uL 180  nRBC Latest Ref Range: 0.0 - 0.2 % 0.0    Review of Systems  Constitution: Negative for decreased appetite, malaise/fatigue, weight gain and weight loss.  HENT: Negative for congestion.   Eyes: Negative for visual disturbance.  Cardiovascular:  Negative for chest pain, claudication, dyspnea on exertion, leg swelling, palpitations and syncope.  Respiratory: Negative for shortness of breath.   Endocrine: Negative for cold intolerance.  Hematologic/Lymphatic: Does not bruise/bleed easily.  Skin: Negative for itching and rash.  Musculoskeletal: Negative for myalgias.  Gastrointestinal: Negative for abdominal pain, nausea and vomiting.  Genitourinary: Negative for dysuria.  Neurological: Negative for dizziness and weakness.  Psychiatric/Behavioral: The patient is not nervous/anxious.   All other systems reviewed and are negative.        Vitals:   03/08/19 0952  BP: 131/82  Pulse: 66  Temp: 98 F (36.7 C)  SpO2: 98%   Objective:   Physical Exam  Constitutional: He is oriented to person, place, and time. He appears well-developed and well-nourished. No distress.  HENT:  Head: Normocephalic and atraumatic.  Eyes: Pupils are equal, round, and reactive to light. Conjunctivae are normal.  Neck: No JVD present.  Cardiovascular: Normal rate, regular rhythm and intact distal pulses.  No murmur heard. Pulmonary/Chest: Effort normal and breath sounds normal. He has no wheezes. He has no rales.  Abdominal: Soft. Bowel sounds are normal. There is no rebound.  Musculoskeletal:        General: No edema.  Lymphadenopathy:    He has no cervical adenopathy.  Neurological: He is alert and oriented to person, place, and time. No cranial nerve deficit.  Skin: Skin is warm and dry.  Psychiatric: He has a normal mood and affect.  Nursing note and vitals reviewed.         Assessment & Recommendations:    34 y/oRussian Americanmalewith hypertension,CAD with NSTEMI 08/2019-OM1 PCI, dilated ascending aorta 4.5 cm.  Coronary artery disease without angina: Non-STEMI 08/2018.  Successful aspiration thrombectomy and stent placement to large OM1 100% occlusion.  Synergy 3.0 x 20 mm DES. Mild to moderate residual coronary artery  disease. Preserved LVEF  with no significant wall motion abnormality on echocardiogram. Ideally, recommend dual antiplatelet therapy with aspirin and Brilinta till 08/2019. However, given significant bleeding after recent abscess treatment, I would be okay with holding Briinta 5 days before the next extraction. He has been on 6 months of DAPT and risk of stent thrombosis is relatively low.  Continue metoprolol succinate 50 mg daily. Continue Lipitor 20 mg for now. Will check lipid panel and CMP and consider increasing lipitor to 40 mg daily.   Dilated ascending aorta: As both echocardiogram and CT angiogram correlate with each other, recommend repeat echocardiogram in March 2021.  I will see him in 6 months.   Elder Negus, MD [facility] [place] Pager: [phone] Office: [phone] If no answer Cell [phone]

## 2019-03-09 DIAGNOSIS — I2510 Atherosclerotic heart disease of native coronary artery without angina pectoris: Secondary | ICD-10-CM | POA: Diagnosis not present

## 2019-03-10 LAB — COMPREHENSIVE METABOLIC PANEL
ALT: 37 IU/L (ref 0–44)
AST: 24 IU/L (ref 0–40)
Albumin/Globulin Ratio: 2 (ref 1.2–2.2)
Albumin: 4.6 g/dL (ref 3.8–4.8)
Alkaline Phosphatase: 98 IU/L (ref 39–117)
BUN/Creatinine Ratio: 18 (ref 10–24)
BUN: 21 mg/dL (ref 8–27)
Bilirubin Total: 1 mg/dL (ref 0.0–1.2)
CO2: 22 mmol/L (ref 20–29)
Calcium: 9.5 mg/dL (ref 8.6–10.2)
Chloride: 103 mmol/L (ref 96–106)
Creatinine, Ser: 1.17 mg/dL (ref 0.76–1.27)
GFR calc Af Amer: 73 mL/min/{1.73_m2} (ref >59)
GFR calc non Af Amer: 63 mL/min/{1.73_m2} (ref >59)
Globulin, Total: 2.3 g/dL (ref 1.5–4.5)
Glucose: 89 mg/dL (ref 65–99)
Potassium: 4.8 mmol/L (ref 3.5–5.2)
Sodium: 139 mmol/L (ref 134–144)
Total Protein: 6.9 g/dL (ref 6.0–8.5)

## 2019-03-10 LAB — LIPID PANEL
Chol/HDL Ratio: 4 ratio (ref 0.0–5.0)
Cholesterol, Total: 165 mg/dL (ref 100–199)
HDL: 41 mg/dL (ref >39)
LDL Chol Calc (NIH): 97 mg/dL (ref 0–99)
Triglycerides: 156 mg/dL — ABNORMAL HIGH (ref 0–149)
VLDL Cholesterol Cal: 27 mg/dL (ref 5–40)

## 2019-03-10 MED ORDER — ATORVASTATIN CALCIUM 40 MG PO TABS: 40.0000 mg | ORAL_TABLET | Freq: Every day | ORAL | 3 refills | Status: DC

## 2019-03-10 NOTE — Addendum Note (Signed)
Addended by: Nigel Mormon on: 03/10/2019 06:21 PM   Modules accepted: Orders

## 2019-03-15 ENCOUNTER — Encounter (HOSPITAL_COMMUNITY): Payer: Self-pay

## 2019-03-15 ENCOUNTER — Telehealth (HOSPITAL_COMMUNITY): Payer: Self-pay | Admitting: *Deleted

## 2019-03-15 NOTE — Telephone Encounter (Signed)
-----   Message from [facility], MD sent at 03/15/2019  5:03 AM EDT ----- Regarding: RE: Any limitations during group exercise at Cardiac Rehab. Sorry for late reply.  Okay to resume cardiac rehab with low to moderate intensity aerobic activity. Avoid lifting/pushing/pulling weight >20 lbs Avoid SBP >180 mmHg  Thanks MJP  ----- Message ----- From: Rowe Pavy, RN Sent: 03/09/2019   4:34 PM EDT To: Nigel Mormon, MD Subject: Any limitations during group exercise at [facility]  Dr. Virgina Jock,  Pt seen in follow up by you on 10/1.  Pt is eligible to participate in Cardiac Rehab.  Pt daughter wanted her father to be seen again prior to committing to scheduling. Noted in your office note pt has dilated ascending aorta 4.5.   Any restrictions or limitations of activity?  Hand weight limit?  Bp parameters for resting and on exertion?  If so, please indicate  your preference.  Thank you for your input  Maurice Small RN, BSN Cardiac and Pulmonary Rehab Nurse Navigator

## 2019-04-06 ENCOUNTER — Other Ambulatory Visit: Payer: Self-pay | Admitting: Internal Medicine

## 2019-07-04 ENCOUNTER — Ambulatory Visit: Payer: Medicare HMO | Admitting: Internal Medicine

## 2019-07-04 ENCOUNTER — Other Ambulatory Visit: Payer: Self-pay

## 2019-07-04 ENCOUNTER — Ambulatory Visit (INDEPENDENT_AMBULATORY_CARE_PROVIDER_SITE_OTHER): Payer: Medicare HMO | Admitting: Internal Medicine

## 2019-07-04 ENCOUNTER — Encounter: Payer: Self-pay | Admitting: Internal Medicine

## 2019-07-04 DIAGNOSIS — I2510 Atherosclerotic heart disease of native coronary artery without angina pectoris: Secondary | ICD-10-CM

## 2019-07-04 DIAGNOSIS — R1012 Left upper quadrant pain: Secondary | ICD-10-CM

## 2019-07-04 DIAGNOSIS — R918 Other nonspecific abnormal finding of lung field: Secondary | ICD-10-CM | POA: Diagnosis not present

## 2019-07-04 LAB — HEPATIC FUNCTION PANEL
ALT: 33 U/L (ref 0–53)
AST: 25 U/L (ref 0–37)
Albumin: 4.4 g/dL (ref 3.5–5.2)
Alkaline Phosphatase: 84 U/L (ref 39–117)
Bilirubin, Direct: 0.2 mg/dL (ref 0.0–0.3)
Total Bilirubin: 1 mg/dL (ref 0.2–1.2)
Total Protein: 7.1 g/dL (ref 6.0–8.3)

## 2019-07-04 LAB — CBC WITH DIFFERENTIAL/PLATELET
Basophils Absolute: 0 10*3/uL (ref 0.0–0.1)
Basophils Relative: 0.5 % (ref 0.0–3.0)
Eosinophils Absolute: 0.3 10*3/uL (ref 0.0–0.7)
Eosinophils Relative: 4.5 % (ref 0.0–5.0)
HCT: 38.4 % — ABNORMAL LOW (ref 39.0–52.0)
Hemoglobin: 13.2 g/dL (ref 13.0–17.0)
Lymphocytes Relative: 38 % (ref 12.0–46.0)
Lymphs Abs: 2.2 10*3/uL (ref 0.7–4.0)
MCHC: 34.3 g/dL (ref 30.0–36.0)
MCV: 89.3 fl (ref 78.0–100.0)
Monocytes Absolute: 0.4 10*3/uL (ref 0.1–1.0)
Monocytes Relative: 7.5 % (ref 3.0–12.0)
Neutro Abs: 2.9 10*3/uL (ref 1.4–7.7)
Neutrophils Relative %: 49.5 % (ref 43.0–77.0)
Platelets: 210 10*3/uL (ref 150.0–400.0)
RBC: 4.3 Mil/uL (ref 4.22–5.81)
RDW: 13.7 % (ref 11.5–15.5)
WBC: 5.8 10*3/uL (ref 4.0–10.5)

## 2019-07-04 LAB — URINALYSIS
Bilirubin Urine: NEGATIVE
Hgb urine dipstick: NEGATIVE
Ketones, ur: NEGATIVE
Leukocytes,Ua: NEGATIVE
Nitrite: NEGATIVE
Specific Gravity, Urine: 1.025 (ref 1.000–1.030)
Total Protein, Urine: NEGATIVE
Urine Glucose: NEGATIVE
Urobilinogen, UA: 0.2 (ref 0.0–1.0)
pH: 5 (ref 5.0–8.0)

## 2019-07-04 LAB — BASIC METABOLIC PANEL
BUN: 18 mg/dL (ref 6–23)
CO2: 26 mEq/L (ref 19–32)
Calcium: 9.3 mg/dL (ref 8.4–10.5)
Chloride: 107 mEq/L (ref 96–112)
Creatinine, Ser: 0.96 mg/dL (ref 0.40–1.50)
GFR: 77.39 mL/min (ref >60.00)
Glucose, Bld: 96 mg/dL (ref 70–99)
Potassium: 4.1 mEq/L (ref 3.5–5.1)
Sodium: 138 mEq/L (ref 135–145)

## 2019-07-04 MED ORDER — TRIAMCINOLONE ACETONIDE 0.1 % EX OINT: 1.0000 "application " | TOPICAL_OINTMENT | Freq: Two times a day (BID) | CUTANEOUS | 2 refills | Status: DC

## 2019-07-04 MED ORDER — VITAMIN D3 50 MCG (2000 UT) PO CAPS: 2000.0000 [IU] | ORAL_CAPSULE | Freq: Every day | ORAL | 3 refills | Status: DC

## 2019-07-04 NOTE — Assessment & Plan Note (Signed)
Repeat CT

## 2019-07-04 NOTE — Progress Notes (Signed)
Subjective:  Patient ID: Alexander Hughes, male    DOB: 04/14/1951  Age: 69 y.o. MRN: [id number]  CC: No chief complaint on file.   HPI Alexander Hughes presents for dyslipidemia, HTN, CAD Not taking Vit D for ?reason C/o L flank pain in supine position x 3 months  Outpatient Medications Prior to Visit  Medication Sig Dispense Refill  . aspirin EC 81 MG EC tablet Take 1 tablet (81 mg total) by mouth daily. 60 tablet 3  . atorvastatin (LIPITOR) 40 MG tablet Take 1 tablet (40 mg total) by mouth daily. 90 tablet 3  . betamethasone dipropionate (DIPROLENE) 0.05 % cream Apply topically 2 (two) times daily. 45 g 2  . Cholecalciferol (VITAMIN D3) 50 MCG (2000 UT) capsule Take 1 capsule (2,000 Units total) by mouth daily. 100 capsule 3  . metoprolol succinate (TOPROL XL) 50 MG 24 hr tablet Take 1 tablet (50 mg total) by mouth daily. Take with or immediately following a meal. 90 tablet 3  . nitroGLYCERIN (NITROSTAT) 0.4 MG SL tablet Place 1 tablet (0.4 mg total) under the tongue every 5 (five) minutes as needed for chest pain. 30 tablet 3  . pantoprazole (PROTONIX) 40 MG tablet Take 1 tablet (40 mg total) by mouth daily. 90 tablet 3  . ticagrelor (BRILINTA) 90 MG TABS tablet Take 1 tablet (90 mg total) by mouth 2 (two) times daily. 120 tablet 3   No facility-administered medications prior to visit.    ROS: Review of Systems  Constitutional: Positive for unexpected weight change. Negative for appetite change and fatigue.  HENT: Negative for congestion, nosebleeds, sneezing, sore throat and trouble swallowing.   Eyes: Negative for itching and visual disturbance.  Respiratory: Negative for cough.   Cardiovascular: Negative for chest pain, palpitations and leg swelling.  Gastrointestinal: Negative for abdominal distention, blood in stool, diarrhea and nausea.  Genitourinary: Negative for frequency and hematuria.  Musculoskeletal: Negative for back pain, gait problem, joint swelling and neck  pain.  Skin: Negative for rash.  Neurological: Negative for dizziness, tremors, speech difficulty and weakness.  Psychiatric/Behavioral: Negative for agitation, dysphoric mood and sleep disturbance. The patient is not nervous/anxious.     Objective:  BP (!) 142/76 (BP Location: Left Arm, Patient Position: Sitting, Cuff Size: Large)   Pulse 62   Temp 98.3 F (36.8 C) (Oral)   Ht 6\' 1"  (1.854 m)   Wt 230 lb (104.3 kg)   SpO2 97%   BMI 30.34 kg/m   BP Readings from Last 3 Encounters:  07/04/19 (!) 142/76  03/08/19 131/82  09/06/18 140/87    Wt Readings from Last 3 Encounters:  07/04/19 230 lb (104.3 kg)  03/08/19 219 lb (99.3 kg)  09/06/18 223 lb (101.2 kg)    Physical Exam Constitutional:      General: He is not in acute distress.    Appearance: He is well-developed.     Comments: NAD  Eyes:     Conjunctiva/sclera: Conjunctivae normal.     Pupils: Pupils are equal, round, and reactive to light.  Neck:     Thyroid: No thyromegaly.     Vascular: No JVD.  Cardiovascular:     Rate and Rhythm: Normal rate and regular rhythm.     Heart sounds: Normal heart sounds. No murmur. No friction rub. No gallop.   Pulmonary:     Effort: Pulmonary effort is normal. No respiratory distress.     Breath sounds: Normal breath sounds. No wheezing or rales.  Chest:  Chest wall: No tenderness.  Abdominal:     General: Bowel sounds are normal. There is no distension.     Palpations: Abdomen is soft. There is no mass.     Tenderness: There is no abdominal tenderness. There is no guarding or rebound.  Musculoskeletal:        General: No tenderness. Normal range of motion.     Cervical back: Normal range of motion.  Lymphadenopathy:     Cervical: No cervical adenopathy.  Skin:    General: Skin is warm and dry.     Findings: No rash.  Neurological:     Mental Status: He is alert and oriented to person, place, and time.     Cranial Nerves: No cranial nerve deficit.     Motor: No  abnormal muscle tone.     Coordination: Coordination normal.     Gait: Gait normal.     Deep Tendon Reflexes: Reflexes are normal and symmetric.  Psychiatric:        Behavior: Behavior normal.        Thought Content: Thought content normal.        Judgment: Judgment normal.   LUQ tender on the R flank/side  Lab Results  Component Value Date   WBC 7.3 08/26/2018   HGB 12.7 (L) 08/26/2018   HCT 36.9 (L) 08/26/2018   PLT 180 08/26/2018   GLUCOSE 89 03/09/2019   CHOL 165 03/09/2019   TRIG 156 (H) 03/09/2019   HDL 41 03/09/2019   LDLCALC 97 03/09/2019   ALT 37 03/09/2019   AST 24 03/09/2019   NA 139 03/09/2019   K 4.8 03/09/2019   CL 103 03/09/2019   CREATININE 1.17 03/09/2019   BUN 21 03/09/2019   CO2 22 03/09/2019   TSH 0.98 02/17/2017   PSA 2.91 02/17/2017   INR 1.0 08/25/2018   HGBA1C 5.4 08/26/2018    DG Chest 2 View  Result Date: 08/24/2018 CLINICAL DATA:  Chest pain and diaphoresis EXAM: CHEST - 2 VIEW COMPARISON:  None. FINDINGS: There is atelectatic change in the left base. The lungs elsewhere are clear. The heart size and pulmonary vascularity are normal. No adenopathy. No pneumothorax. There is degenerative change in each shoulder. IMPRESSION: Left base atelectasis. No edema or consolidation. Heart size within normal limits. Electronically Signed   By: Lowella Grip III M.D.   On: 08/24/2018 21:01   CT Angio Chest PE W/Cm &/Or Wo Cm  Result Date: 08/24/2018 CLINICAL DATA:  Chest pain EXAM: CT ANGIOGRAPHY CHEST WITH CONTRAST TECHNIQUE: Multidetector CT imaging of the chest was performed using the standard protocol during bolus administration of intravenous contrast. Multiplanar CT image reconstructions and MIPs were obtained to evaluate the vascular anatomy. CONTRAST:  165mL OMNIPAQUE IOHEXOL 350 MG/ML SOLN COMPARISON:  Chest x-ray earlier today FINDINGS: Cardiovascular: Heart is normal size. Aorta is normal caliber. No filling defects in the pulmonary arteries to  suggest pulmonary emboli. Scattered coronary artery calcifications. Mediastinum/Nodes: No mediastinal, hilar, or axillary adenopathy. Lungs/Pleura: 5 mm right middle lobe nodule on image 88. Small right upper lobe nodules on image 73, the largest 5-6 mm. No confluent opacities or effusions. Upper Abdomen: Imaging into the upper abdomen shows no acute findings. Musculoskeletal: Chest wall soft tissues are unremarkable. No acute bony abnormality. Review of the MIP images confirms the above findings. IMPRESSION: No evidence of pulmonary embolus. Scattered coronary artery calcifications. Small right pulmonary nodules, 5-6 mm. Non-contrast chest CT at 3-6 months is recommended. If the nodules are stable  at time of repeat CT, then future CT at 18-24 months (from today's scan) is considered optional for low-risk patients, but is recommended for high-risk patients. This recommendation follows the consensus statement: Guidelines for Management of Incidental Pulmonary Nodules Detected on CT Images: From the Fleischner Society 2017; Radiology 2017; 284:228-243. Electronically Signed   By: Charlett Nose M.D.   On: 08/24/2018 22:50   CARDIAC CATHETERIZATION  Result Date: 08/25/2018 LM: Normal LAD: Ostial-prox LAD 30% stenosis        Prox LAD 50% stenosis        Diag 1 ostial 30% stenosis LCx: Dominant vessel with prox-mid 20% stenosis        Large OM1 with 100% mid vessel occlusion. 50% ostial OM1 stenosis        Successful percutaneous coronary intervention        Aspiration thrombectomy, PTCA and stent placement        Synergy 3.0 X 20 mm drug-eluting stent mid OM1 RCA: Nondomiant vessel. No significant disease        DAPT for 1 year Aggressive medical management for residual mild to moderate disease  ECHOCARDIOGRAM COMPLETE  Result Date: 08/25/2018   ECHOCARDIOGRAM REPORT   Patient Name:   IZEKIEL FLEGEL Date of Exam: 08/25/2018 Medical Rec #:  [id number]         Height:       73.0 in Accession #:    [id number]         Weight:       228.6 lb Date of Birth:  [birth year]        BSA:          2.28 m Patient Age:    67 years          BP:           149/81 mmHg Patient Gender: M                 HR:           53 bpm. Exam Location:  Inpatient  Procedure: 2D Echo, Cardiac Doppler and Color Doppler Indications:    R07.9* Chest pain, unspecified  History:        Patient has no prior history of Echocardiogram examinations.                 CHF.  Sonographer:    Elmarie Shiley Dance Referring Phys: [id number] ARSHAD N KAKRAKANDY IMPRESSIONS  1. The left ventricle has normal systolic function with an ejection fraction of 60-65%. The cavity size was normal. Left ventricular diastolic Doppler parameters are consistent with impaired relaxation. No evidence of left ventricular regional wall motion abnormalities.  2. The right ventricle has normal systolic function. The cavity was normal. There is no increase in right ventricular wall thickness. Right ventricular systolic pressure normal with an estimated pressure of 20.8 mmHg.  3. Left atrial size was mildly dilated.  4. The aortic valve is tricuspid. Aortic valve regurgitation is mild by color flow Doppler.  5. There is moderate dilatation of the aortic root and mild dilatation of the ascending aorta. Consider further imaging such as CT or MR angiography for further evaluation.  6. The mitral valve is grossly normal.  7. The tricuspid valve is grossly normal. FINDINGS  Left Ventricle: The left ventricle has normal systolic function, with an ejection fraction of 60-65%. The cavity size was normal. There is borderline increase in left ventricular wall thickness. Left ventricular diastolic Doppler parameters are consistent with impaired relaxation. No  evidence of left ventricular regional wall motion abnormalities.. Right Ventricle: The right ventricle has normal systolic function. The cavity was normal. There is no increase in right ventricular wall thickness. Right ventricular systolic pressure normal with an  estimated pressure of 20.8 mmHg. Left Atrium: left atrial size was mildly dilated Right Atrium: right atrial size was normal in size. Right atrial pressure is estimated at 3 mmHg. Interatrial Septum: No atrial level shunt detected by color flow Doppler. Pericardium: There is no evidence of pericardial effusion. Mitral Valve: The mitral valve is grossly normal. Mitral valve regurgitation is mild by color flow Doppler. Tricuspid Valve: The tricuspid valve is grossly normal. Tricuspid valve regurgitation is mild by color flow Doppler. Aortic Valve: The aortic valve is tricuspid Aortic valve regurgitation is mild by color flow Doppler. Pulmonic Valve: The pulmonic valve was grossly normal. Pulmonic valve regurgitation is trivial by color flow Doppler. Aorta: There is moderate dilatation of the aortic root. Venous: The inferior vena cava is normal in size with greater than 50% respiratory variability.  LEFT VENTRICLE PLAX 2D LVIDd:         4.90 cm  Diastology LVIDs:         3.60 cm  LV e' lateral:   7.94 cm/s LV PW:         1.20 cm  LV E/e' lateral: 9.5 LV IVS:        1.50 cm  LV e' medial:    5.98 cm/s LVOT diam:     2.40 cm  LV E/e' medial:  12.7 LV SV:         58 ml LV SV Index:   25.01 LVOT Area:     4.52 cm RIGHT VENTRICLE RV S prime:     11.60 cm/s TAPSE (M-mode): 2.6 cm RVSP:           20.8 mmHg LEFT ATRIUM             Index       RIGHT ATRIUM           Index LA diam:        4.30 cm 1.89 cm/m  RA Pressure: 3 mmHg LA Vol (A2C):   91.6 ml 40.22 ml/m RA Area:     21.60 cm LA Vol (A4C):   81.5 ml 35.79 ml/m RA Volume:   58.60 ml  25.73 ml/m LA Biplane Vol: 88.2 ml 38.73 ml/m  AORTIC VALVE LVOT Vmax:   141.00 cm/s LVOT Vmean:  87.000 cm/s LVOT VTI:    0.314 m AR PHT:      1142 msec  AORTA Ao Root diam: 4.50 cm Ao Asc diam:  4.15 cm MITRAL VALVE              TRICUSPID VALVE MV Area (PHT): 3.60 cm   TR Peak grad:   17.8 mmHg MV PHT:        61.19 msec TR Vmax:        211.00 cm/s MV Decel Time: 211 msec   RVSP:            20.8 mmHg MV E velocity: 75.80 cm/s MV A velocity: 62.20 cm/s SHUNTS MV E/A ratio:  1.22       Systemic VTI:  0.31 m                           Systemic Diam: 2.40 cm  Nona Dell MD Electronically signed by Nona Dell MD Signature Date/Time:  08/25/2018/11:00:26 AM    Final     Assessment & Plan:   There are no diagnoses linked to this encounter.   No orders of the defined types were placed in this encounter.    Follow-up: No follow-ups on file.  Sonda PrimesAlex , MD

## 2019-07-04 NOTE — Assessment & Plan Note (Addendum)
?  MSK Abd [place] Tylenol

## 2019-07-26 ENCOUNTER — Inpatient Hospital Stay: Admission: RE | Admit: 2019-07-26 | Payer: Medicare HMO | Source: Ambulatory Visit

## 2019-07-27 ENCOUNTER — Other Ambulatory Visit: Payer: Medicare HMO

## 2019-08-02 ENCOUNTER — Encounter: Payer: Self-pay | Admitting: Internal Medicine

## 2019-08-10 ENCOUNTER — Ambulatory Visit (INDEPENDENT_AMBULATORY_CARE_PROVIDER_SITE_OTHER)
Admission: RE | Admit: 2019-08-10 | Discharge: 2019-08-10 | Disposition: A | Discharge status: Home / Self Care | Payer: Medicare HMO | Source: Ambulatory Visit | Attending: Internal Medicine | Admitting: Internal Medicine

## 2019-08-10 ENCOUNTER — Other Ambulatory Visit: Payer: Self-pay

## 2019-08-10 DIAGNOSIS — R918 Other nonspecific abnormal finding of lung field: Secondary | ICD-10-CM | POA: Diagnosis not present

## 2019-08-15 ENCOUNTER — Ambulatory Visit: Admission: RE | Admit: 2019-08-15 | Payer: Medicare HMO | Source: Ambulatory Visit

## 2019-08-16 ENCOUNTER — Ambulatory Visit
Admission: RE | Admit: 2019-08-16 | Discharge: 2019-08-16 | Disposition: A | Discharge status: Home / Self Care | Payer: Medicare HMO | Source: Ambulatory Visit | Attending: Internal Medicine | Admitting: Internal Medicine

## 2019-08-16 DIAGNOSIS — R1012 Left upper quadrant pain: Secondary | ICD-10-CM | POA: Diagnosis not present

## 2019-09-03 DIAGNOSIS — S60452A Superficial foreign body of right middle finger, initial encounter: Secondary | ICD-10-CM | POA: Diagnosis not present

## 2019-09-03 DIAGNOSIS — W458XXA Other foreign body or object entering through skin, initial encounter: Secondary | ICD-10-CM | POA: Diagnosis not present

## 2019-09-06 ENCOUNTER — Other Ambulatory Visit: Payer: Self-pay

## 2019-09-06 ENCOUNTER — Ambulatory Visit: Payer: Medicare HMO | Admitting: Cardiology

## 2019-09-06 ENCOUNTER — Encounter: Payer: Self-pay | Admitting: Cardiology

## 2019-09-06 VITALS — BP 143/77 | HR 60 | Ht 73.0 in | Wt 233.0 lb

## 2019-09-06 DIAGNOSIS — I2510 Atherosclerotic heart disease of native coronary artery without angina pectoris: Secondary | ICD-10-CM | POA: Diagnosis not present

## 2019-09-06 DIAGNOSIS — E782 Mixed hyperlipidemia: Secondary | ICD-10-CM | POA: Insufficient documentation

## 2019-09-06 DIAGNOSIS — I77810 Thoracic aortic ectasia: Secondary | ICD-10-CM

## 2019-09-06 NOTE — Progress Notes (Signed)
Subjective:   Alexander Hughes, male    DOB: [birth year], 69 y.o.   MRN: [id number]   Chief complaint:  Coronary artery disease  HPI  69 y/oRussian Americanmalewith hypertension,CAD with NSTEMI 08/2019-OM1 PCI, dilated ascending aorta 4.5 cm.  He is doing well. He denies chest pain, shortness of breath, palpitations, leg edema, orthopnea, PND, TIA/syncope. He stopped his Brilinta after completing 1 year of DAPT.   Current Outpatient Medications on File Prior to Visit  Medication Sig Dispense Refill  . aspirin EC 81 MG EC tablet Take 1 tablet (81 mg total) by mouth daily. 60 tablet 3  . atorvastatin (LIPITOR) 40 MG tablet Take 1 tablet (40 mg total) by mouth daily. 90 tablet 3  . Cholecalciferol (VITAMIN D3) 50 MCG (2000 UT) capsule Take 1 capsule (2,000 Units total) by mouth daily. 100 capsule 3  . metoprolol succinate (TOPROL XL) 50 MG 24 hr tablet Take 1 tablet (50 mg total) by mouth daily. Take with or immediately following a meal. 90 tablet 3  . nitroGLYCERIN (NITROSTAT) 0.4 MG SL tablet Place 1 tablet (0.4 mg total) under the tongue every 5 (five) minutes as needed for chest pain. 30 tablet 3  . pantoprazole (PROTONIX) 40 MG tablet Take 1 tablet (40 mg total) by mouth daily. 90 tablet 3  . triamcinolone ointment (KENALOG) 0.1 % Apply 1 application topically 2 (two) times daily. 80 g 2  . ticagrelor (BRILINTA) 90 MG TABS tablet Take 1 tablet (90 mg total) by mouth 2 (two) times daily. (Patient not taking: Reported on 09/06/2019) 120 tablet 3   No current facility-administered medications on file prior to visit.    Cardiac Studies:  EKG 09/06/2019: Sinus rhythm 57 bpm. Normal EKG.  CT Chest 08/10/2019: 1. Stable bilateral pulmonary nodules measuring up to 5 mm. No new or enlarging pulmonary nodules. A repeat CT in 6-12 months from today's scan is considered optional for low risk patients, but is recommended for high risk patients. This recommendation follows the consensus  statement: Guidelines for Management of Incidental Pulmonary Nodules Detected on CT Images: From the Fleischner Society 2017; Radiology 2017; 284:228-243. 2. Ectasia of the ascending thoracic aorta measures 4.3 cm in diameter. Recommend annual imaging followup by CTA or MRA. This recommendation follows 2010 ACCF/AHA/AATS/ACR/ASA/SCA/SCAI/SIR/STS/SVM Guidelines for the Diagnosis and Management of Patients with Thoracic Aortic Disease. Circulation. 2010; 121: F027-X412. Aortic aneurysm NOS (ICD10-I71.9)   Coronary angiography and intervention 08/25/2018: LM: Normal LAD: Ostial-prox LAD 30% stenosis        Prox LAD 50% stenosis        Diag 1 ostial 30% stenosis LCx: Dominant vessel with prox-mid 20% stenosis        Large OM1 with 100% mid vessel occlusion. 50% ostial OM1 stenosis        Successful percutaneous coronary intervention         Aspiration thrombectomy, PTCA and stent placement        Synergy 3.0 X 20 mm drug-eluting stent mid OM1 RCA: Nondomiant vessel. No significant disease         DAPT for 1 year Aggressive medical management for residual mild to moderate disease  EKG 08/26/2018: Sinus rhythm. Age indeterminate posterior infarct.  No acute ischemic change  Echocardiogram 08/25/2018: LVEF 60-65%. Grade 1 DD Normal RV function RVSP 21 mmhg Mild LA dilatation Moderate aortic root dilatation (4.2 cm) Ascending aorta dilatation 4.5 cm  Cath 08/25/2018: LM: Normal LAD: Ostial-prox LAD 30% stenosis Prox LAD 50% stenosis Diag 1 ostial  30% stenosis LCx: Dominant vessel with prox-mid 20% stenosis Large OM1 with 100% mid vessel occlusion. 50% ostial OM1 stenosis Successful percutaneous coronary intervention  Aspiration thrombectomy, PTCA and stent placement Synergy 3.0 X 20 mm drug-eluting stent mid OM1 RCA: Nondomiant vessel. No significant disease  DAPT for 1 year Aggressive medical management for residual mild to  moderate disease  Recent labs: 07/04/2019: Glucose 96, BUN/Cr 18/0.96. EGFR 77. Na/K 138/4.1. Rest of the CMP normal H/H 13/38. MCV 89. Platelets 210  Lipid panel: 03/09/2019: Chol 165, TG 156, HDL 41, LDL 97 08/25/2018: Chol 205, TG 101, HDL 45, LDL 140    Review of Systems  Cardiovascular: Negative for chest pain, dyspnea on exertion, leg swelling, palpitations and syncope.         Vitals:   09/06/19 1023 09/06/19 1026  BP: (!) 153/78 (!) 143/77  Pulse: 60 60  SpO2: 99%    Objective:   Physical Exam  Constitutional: He appears well-developed and well-nourished.  Neck: No JVD present.  Cardiovascular: Normal rate, regular rhythm, normal heart sounds and intact distal pulses.  No murmur heard. Pulmonary/Chest: Effort normal and breath sounds normal. He has no wheezes. He has no rales.  Musculoskeletal:        General: No edema.  Nursing note and vitals reviewed.         Assessment & Recommendations:    69 y/oRussian Americanmalewith hypertension,CAD with NSTEMI 08/2019-OM1 PCI, dilated ascending aorta 4.5 cm.  Coronary artery disease without angina: Non-STEMI 08/2018.  Successful aspiration thrombectomy and stent placement to large OM1 100% occlusion.  Synergy 3.0 x 20 mm DES. Mild to moderate residual coronary artery disease. Preserved LVEF with no significant wall motion abnormality on echocardiogram. Completed 1 year DAPT. Continue Aspirin 82 mg daily. Continue metoprolol succinate 50 mg daily. LDL down from 140 in 08/2018 to 97 in 03/2019 Check lipid panel. If LDL remains >70, increase lipitor to 80 mg daily.   Dilated ascending aorta: Ectatic ascending aorta measuring 4.3 cm. Repeat CTA in March 2022.  Stable pulmonary nodule: This can also be followed up on CTA in March 2022   Nigel Mormon, MD [facility] [place] Pager: [phone] Office: [phone] If no answer Cell [phone]

## 2019-09-27 ENCOUNTER — Encounter: Payer: Self-pay | Admitting: Internal Medicine

## 2019-09-27 ENCOUNTER — Ambulatory Visit (INDEPENDENT_AMBULATORY_CARE_PROVIDER_SITE_OTHER): Payer: Medicare HMO | Admitting: Internal Medicine

## 2019-09-27 ENCOUNTER — Other Ambulatory Visit: Payer: Self-pay

## 2019-09-27 DIAGNOSIS — I2510 Atherosclerotic heart disease of native coronary artery without angina pectoris: Secondary | ICD-10-CM

## 2019-09-27 DIAGNOSIS — I77810 Thoracic aortic ectasia: Secondary | ICD-10-CM

## 2019-09-27 DIAGNOSIS — K760 Fatty (change of) liver, not elsewhere classified: Secondary | ICD-10-CM

## 2019-09-27 DIAGNOSIS — E782 Mixed hyperlipidemia: Secondary | ICD-10-CM

## 2019-09-27 DIAGNOSIS — I10 Essential (primary) hypertension: Secondary | ICD-10-CM | POA: Diagnosis not present

## 2019-09-27 DIAGNOSIS — E785 Hyperlipidemia, unspecified: Secondary | ICD-10-CM

## 2019-09-27 MED ORDER — METOPROLOL SUCCINATE ER 50 MG PO TB24: 50.0000 mg | ORAL_TABLET | Freq: Every day | ORAL | 3 refills | Status: DC

## 2019-09-27 MED ORDER — PANTOPRAZOLE SODIUM 40 MG PO TBEC: 40.0000 mg | DELAYED_RELEASE_TABLET | Freq: Every day | ORAL | 3 refills | Status: DC

## 2019-09-27 MED ORDER — ATORVASTATIN CALCIUM 40 MG PO TABS: 40.0000 mg | ORAL_TABLET | Freq: Every day | ORAL | 3 refills | Status: DC

## 2019-09-27 NOTE — Assessment & Plan Note (Signed)
Cut back on carbs Lipitor

## 2019-09-27 NOTE — Assessment & Plan Note (Signed)
F/u CT q 12 months

## 2019-09-27 NOTE — Assessment & Plan Note (Signed)
Toprol 

## 2019-09-27 NOTE — Assessment & Plan Note (Signed)
Lipitor 

## 2019-09-27 NOTE — Progress Notes (Signed)
Subjective:  Patient ID: Amman Bartel, male    DOB: Oct 28, 1950  Age: 69 y.o. MRN: [id number]  CC: No chief complaint on file.   HPI Burk Hoctor presents for CAD, HTN, GERD f/u  Outpatient Medications Prior to Visit  Medication Sig Dispense Refill  . aspirin EC 81 MG EC tablet Take 1 tablet (81 mg total) by mouth daily. 60 tablet 3  . atorvastatin (LIPITOR) 40 MG tablet Take 1 tablet (40 mg total) by mouth daily. 90 tablet 3  . Cholecalciferol (VITAMIN D3) 50 MCG (2000 UT) capsule Take 1 capsule (2,000 Units total) by mouth daily. 100 capsule 3  . pantoprazole (PROTONIX) 40 MG tablet Take 1 tablet (40 mg total) by mouth daily. 90 tablet 3  . triamcinolone ointment (KENALOG) 0.1 % Apply 1 application topically 2 (two) times daily. 80 g 2  . metoprolol succinate (TOPROL XL) 50 MG 24 hr tablet Take 1 tablet (50 mg total) by mouth daily. Take with or immediately following a meal. 90 tablet 3  . nitroGLYCERIN (NITROSTAT) 0.4 MG SL tablet Place 1 tablet (0.4 mg total) under the tongue every 5 (five) minutes as needed for chest pain. 30 tablet 3   No facility-administered medications prior to visit.    ROS: Review of Systems  Objective:  BP 128/76 (BP Location: Left Arm, Patient Position: Sitting, Cuff Size: Large)   Pulse (!) 56   Temp 98.4 F (36.9 C) (Oral)   Ht 6\' 1"  (1.854 m)   Wt 229 lb (103.9 kg)   SpO2 97%   BMI 30.21 kg/m   BP Readings from Last 3 Encounters:  09/27/19 128/76  09/06/19 (!) 143/77  07/04/19 (!) 142/76    Wt Readings from Last 3 Encounters:  09/27/19 229 lb (103.9 kg)  09/06/19 233 lb (105.7 kg)  07/04/19 230 lb (104.3 kg)    Physical Exam  Lab Results  Component Value Date   WBC 5.8 07/04/2019   HGB 13.2 07/04/2019   HCT 38.4 (L) 07/04/2019   PLT 210.0 07/04/2019   GLUCOSE 96 07/04/2019   CHOL 165 03/09/2019   TRIG 156 (H) 03/09/2019   HDL 41 03/09/2019   LDLCALC 97 03/09/2019   ALT 33 07/04/2019   AST 25 07/04/2019   NA  138 07/04/2019   K 4.1 07/04/2019   CL 107 07/04/2019   CREATININE 0.96 07/04/2019   BUN 18 07/04/2019   CO2 26 07/04/2019   TSH 0.98 02/17/2017   PSA 2.91 02/17/2017   INR 1.0 08/25/2018   HGBA1C 5.4 08/26/2018    08/28/2018 Abdomen Complete  Result Date: 08/16/2019 CLINICAL DATA:  Left upper quadrant abdominal pain. EXAM: ABDOMEN ULTRASOUND COMPLETE COMPARISON:  None. FINDINGS: Gallbladder: No gallstones or wall thickening visualized. No sonographic Murphy sign noted by sonographer. Common bile duct: Diameter: 4 mm which is within normal limits. Liver: Increased echotexture of hepatic parenchyma is noted. 1 cm cyst is noted in right hepatic lobe. Portal vein is patent on color Doppler imaging with normal direction of blood flow towards the liver. IVC: No abnormality visualized. Pancreas: Visualized portion unremarkable. Spleen: Size and appearance within normal limits. Right Kidney: Length: 10.9 cm. Echogenicity within normal limits. No mass or hydronephrosis visualized. Left Kidney: Length: 12.3 cm. Echogenicity within normal limits. No mass or hydronephrosis visualized. Abdominal aorta: No aneurysm visualized. Other findings: None. IMPRESSION: Increased echogenicity of hepatic parenchyma is noted suggesting hepatic steatosis or other diffuse hepatocellular disease. No other significant abnormality seen in the abdomen. Electronically Signed  By: Marijo Conception M.D.   On: 08/16/2019 14:28    Assessment & Plan:    Walker Kehr, MD

## 2019-09-27 NOTE — Assessment & Plan Note (Signed)
Doing well No angina Lipitor, ASA, Toprol

## 2020-01-03 ENCOUNTER — Telehealth: Payer: Self-pay | Admitting: Internal Medicine

## 2020-01-03 NOTE — Telephone Encounter (Signed)
Patient's wife called and said that his bp was 120/60 but he was not feeling well.

## 2020-01-04 NOTE — Telephone Encounter (Signed)
Spoke with pt's daughter, states she will follow up wit her dad and see when he like to schedule an OV and call back

## 2020-01-04 NOTE — Telephone Encounter (Signed)
Blood pressure is normal.  He can see [place] if she is is not feeling well.  Thanks

## 2020-06-02 ENCOUNTER — Other Ambulatory Visit: Payer: Self-pay

## 2020-06-02 ENCOUNTER — Encounter: Payer: Self-pay | Admitting: Internal Medicine

## 2020-06-02 ENCOUNTER — Ambulatory Visit (INDEPENDENT_AMBULATORY_CARE_PROVIDER_SITE_OTHER): Payer: Medicare HMO

## 2020-06-02 ENCOUNTER — Ambulatory Visit (INDEPENDENT_AMBULATORY_CARE_PROVIDER_SITE_OTHER): Payer: Medicare HMO | Admitting: Internal Medicine

## 2020-06-02 DIAGNOSIS — G8929 Other chronic pain: Secondary | ICD-10-CM

## 2020-06-02 DIAGNOSIS — E785 Hyperlipidemia, unspecified: Secondary | ICD-10-CM

## 2020-06-02 DIAGNOSIS — M1711 Unilateral primary osteoarthritis, right knee: Secondary | ICD-10-CM | POA: Diagnosis not present

## 2020-06-02 DIAGNOSIS — I10 Essential (primary) hypertension: Secondary | ICD-10-CM | POA: Diagnosis not present

## 2020-06-02 DIAGNOSIS — M7989 Other specified soft tissue disorders: Secondary | ICD-10-CM | POA: Diagnosis not present

## 2020-06-02 DIAGNOSIS — M25561 Pain in right knee: Secondary | ICD-10-CM | POA: Insufficient documentation

## 2020-06-02 MED ORDER — METHYLPREDNISOLONE ACETATE 40 MG/ML IJ SUSP
40.0000 mg | Freq: Once | INTRAMUSCULAR | Status: AC
  Administered 2020-06-02: 40 mg via INTRAMUSCULAR

## 2020-06-02 NOTE — Assessment & Plan Note (Signed)
Toprol-XL 50 mg daily  No added salt diet

## 2020-06-02 NOTE — Assessment & Plan Note (Addendum)
New R b anserina and OA Ice 4 times a day Voltaren gel 4 times a day X ray Will inject

## 2020-06-02 NOTE — Progress Notes (Signed)
Subjective:  Patient ID: Alexander Hughes, male    DOB: 17-Jun-1950  Age: 69 y.o. MRN: [id number]  CC: Knee Pain   HPI Yaakov Saindon presents for R knee pain x weeks; unable to extend it fully; CAD, HTN.  Outpatient Medications Prior to Visit  Medication Sig Dispense Refill  . aspirin EC 81 MG EC tablet Take 1 tablet (81 mg total) by mouth daily. 60 tablet 3  . atorvastatin (LIPITOR) 40 MG tablet Take 1 tablet (40 mg total) by mouth daily. 90 tablet 3  . Cholecalciferol (VITAMIN D3) 50 MCG (2000 UT) capsule Take 1 capsule (2,000 Units total) by mouth daily. 100 capsule 3  . metoprolol succinate (TOPROL XL) 50 MG 24 hr tablet Take 1 tablet (50 mg total) by mouth daily. Take with or immediately following a meal. 90 tablet 3  . nitroGLYCERIN (NITROSTAT) 0.4 MG SL tablet Place 1 tablet (0.4 mg total) under the tongue every 5 (five) minutes as needed for chest pain. 30 tablet 3  . pantoprazole (PROTONIX) 40 MG tablet Take 1 tablet (40 mg total) by mouth daily. 90 tablet 3  . triamcinolone ointment (KENALOG) 0.1 % Apply 1 application topically 2 (two) times daily. 80 g 2   No facility-administered medications prior to visit.    ROS: Review of Systems  Constitutional: Negative for appetite change, fatigue and unexpected weight change.  HENT: Negative for congestion, nosebleeds, sneezing, sore throat and trouble swallowing.   Eyes: Negative for itching and visual disturbance.  Respiratory: Negative for cough.   Cardiovascular: Negative for chest pain, palpitations and leg swelling.  Gastrointestinal: Negative for abdominal distention, blood in stool, diarrhea and nausea.  Genitourinary: Negative for frequency and hematuria.  Musculoskeletal: Positive for arthralgias. Negative for back pain, gait problem, joint swelling and neck pain.  Skin: Negative for rash.  Neurological: Negative for dizziness, tremors, speech difficulty and weakness.  Psychiatric/Behavioral: Negative for  agitation, dysphoric mood and sleep disturbance. The patient is not nervous/anxious.     Objective:  BP 120/70   Pulse (!) 59   Temp 98.6 F (37 C) (Oral)   Ht 6\' 1"  (1.854 m)   Wt 230 lb (104.3 kg)   SpO2 98%   BMI 30.34 kg/m   BP Readings from Last 3 Encounters:  06/02/20 120/70  09/27/19 128/76  09/06/19 (!) 143/77    Wt Readings from Last 3 Encounters:  06/02/20 230 lb (104.3 kg)  09/27/19 229 lb (103.9 kg)  09/06/19 233 lb (105.7 kg)    Physical Exam Constitutional:      General: He is not in acute distress.    Appearance: He is well-developed.     Comments: NAD  HENT:     Mouth/Throat:     Mouth: Oropharynx is clear and moist.  Eyes:     Conjunctiva/sclera: Conjunctivae normal.     Pupils: Pupils are equal, round, and reactive to light.  Neck:     Thyroid: No thyromegaly.     Vascular: No JVD.  Cardiovascular:     Rate and Rhythm: Normal rate and regular rhythm.     Pulses: Intact distal pulses.     Heart sounds: Normal heart sounds. No murmur heard. No friction rub. No gallop.   Pulmonary:     Effort: Pulmonary effort is normal. No respiratory distress.     Breath sounds: Normal breath sounds. No wheezing or rales.  Chest:     Chest wall: No tenderness.  Abdominal:     General: Bowel sounds  are normal. There is no distension.     Palpations: Abdomen is soft. There is no mass.     Tenderness: There is no abdominal tenderness. There is no guarding or rebound.  Musculoskeletal:        General: Tenderness present. No edema. Normal range of motion.     Cervical back: Normal range of motion.  Lymphadenopathy:     Cervical: No cervical adenopathy.  Skin:    General: Skin is warm and dry.     Findings: No rash.  Neurological:     Mental Status: He is alert and oriented to person, place, and time.     Cranial Nerves: No cranial nerve deficit.     Motor: No abnormal muscle tone.     Coordination: He displays a negative Romberg sign. Coordination normal.      Gait: Gait normal.     Deep Tendon Reflexes: Reflexes are normal and symmetric.  Psychiatric:        Mood and Affect: Mood and affect normal.        Behavior: Behavior normal.        Thought Content: Thought content normal.        Judgment: Judgment normal.     R knee pain in b anserina; unable to extend it fully   Procedure Note :    Procedure : Injection of the   Knee joint  bursa pes anserine injection   Indication: Knee bursitis R with refractory  chronic pain.   Risks including unsuccessful procedure , bleeding, infection, bruising, skin atrophy and others were explained to the patient in detail as well as the benefits. Informed consent was obtained verbally.  Tthe patient was placed in a comfortable position. Skin was prepped with Betadine and alcohol  and anesthetized with a cooling spray. Then, a 3 cc syringe with a 1.5 inch long 25-gauge needle was used for a bursa injection in a fan-like fasion with 3 mL of 2% lidocaine and 40 mg of Depo-Medrol .  Band-Aid was applied.   Tolerated well. Complications: None. Good pain relief following the procedure.   Postprocedure instructions :    A Band-Aid should be left on for 12 hours. Injection therapy is not a cure itself. It is used in conjunction with other modalities. You can use nonsteroidal anti-inflammatories like ibuprofen , hot and cold compresses. Rest is recommended in the next 24 hours. You need to report immediately  if fever, chills or any signs of infection develop.   Lab Results  Component Value Date   WBC 5.8 07/04/2019   HGB 13.2 07/04/2019   HCT 38.4 (L) 07/04/2019   PLT 210.0 07/04/2019   GLUCOSE 96 07/04/2019   CHOL 165 03/09/2019   TRIG 156 (H) 03/09/2019   HDL 41 03/09/2019   LDLCALC 97 03/09/2019   ALT 33 07/04/2019   AST 25 07/04/2019   NA 138 07/04/2019   K 4.1 07/04/2019   CL 107 07/04/2019   CREATININE 0.96 07/04/2019   BUN 18 07/04/2019   CO2 26 07/04/2019   TSH 0.98 02/17/2017   PSA  2.91 02/17/2017   INR 1.0 08/25/2018   HGBA1C 5.4 08/26/2018    US Abdomen Complete  Result Date: 08/16/2019 CLINICAL DATA:  Left upper quadrant abdominal pain. EXAM: ABDOMEN ULTRASOUND COMPLETE COMPARISON:  None. FINDINGS: Gallbladder: No gallstones or wall thickening visualized. No sonographic Murphy sign noted by sonographer. Common bile duct: Diameter: 4 mm which is within normal limits. Liver: Increased echotexture of hepatic parenchyma  is noted. 1 cm cyst is noted in right hepatic lobe. Portal vein is patent on color Doppler imaging with normal direction of blood flow towards the liver. IVC: No abnormality visualized. Pancreas: Visualized portion unremarkable. Spleen: Size and appearance within normal limits. Right Kidney: Length: 10.9 cm. Echogenicity within normal limits. No mass or hydronephrosis visualized. Left Kidney: Length: 12.3 cm. Echogenicity within normal limits. No mass or hydronephrosis visualized. Abdominal aorta: No aneurysm visualized. Other findings: None. IMPRESSION: Increased echogenicity of hepatic parenchyma is noted suggesting hepatic steatosis or other diffuse hepatocellular disease. No other significant abnormality seen in the abdomen. Electronically Signed   By: Lupita Raider M.D.   On: 08/16/2019 14:28    Assessment & Plan:     Follow-up: No follow-ups on file.  Sonda Primes, MD

## 2020-06-02 NOTE — Patient Instructions (Addendum)
Ice 4 times a day Voltaren gel 4 times a day   Bursitis  Bursitis is when the fluid-filled sac (bursa) that covers and protects a joint is swollen (inflamed). Bursitis is most common near joints such as the knees, elbows, hips, and shoulders. It can cause pain and stiffness. Follow these instructions at home: Medicines  Take over-the-counter and prescription medicines only as told by your doctor.  If you were prescribed an antibiotic medicine, take it as told by your doctor. Do not stop taking it even if you start to feel better. General instructions   Rest the affected area as told by your doctor. ? If you can, raise (elevate) the affected area above the level of your heart while you are sitting or lying down. ? Avoid doing things that make the pain worse.  Use a splint, brace, pad, or walking aid as told by your doctor.  If directed, put ice on the affected area: ? If you have a removable splint or brace, take it off as told by your doctor. ? Put ice in a plastic bag. ? Place a towel between your skin and the bag, or between the splint or brace and the bag. ? Leave the ice on for 20 minutes, 2-3 times a day.  Keep all follow-up visits as told by your doctor. This is important. Preventing symptoms Do these things to help you not have symptoms again:  Wear knee pads if you kneel often.  Wear sturdy running or walking shoes that fit you well.  Take a lot of breaks during activities that involve doing the same movements again and again.  Before you do any activity that takes a lot of effort, get your body ready by stretching.  Stay at a healthy weight or lose weight if your doctor says you should. If you need help doing this, ask your doctor.  Exercise often. If you start any new physical activity, do it slowly. Contact a doctor if you:  Have a fever.  Have chills.  Have symptoms that do not get better with treatment or home care. Summary  Bursitis is when the  fluid-filled sac (bursa) that covers and protects a joint is swollen.  Rest the affected area as told by your doctor.  Avoid doing things that make the pain worse.  Put ice on the affected area as told by your doctor. This information is not intended to replace advice given to you by your health care provider. Make sure you discuss any questions you have with your health care provider. Document Revised: 05/06/2017 Document Reviewed: 04/08/2017 Elsevier Patient Education  2020 [employer]     Postprocedure instructions :    A Band-Aid should be left on for 12 hours. Injection therapy is not a cure itself. It is used in conjunction with other modalities. You can use nonsteroidal anti-inflammatories like ibuprofen , hot and cold compresses. Rest is recommended in the next 24 hours. You need to report immediately  if fever, chills or any signs of infection develop.

## 2020-06-02 NOTE — Assessment & Plan Note (Signed)
Atorvastatin - ok to take in am

## 2020-08-14 ENCOUNTER — Telehealth: Payer: Self-pay | Admitting: Internal Medicine

## 2020-08-14 NOTE — Telephone Encounter (Signed)
LVM for pt to rtn my call to schedule AWV with [facility]. Please schedule appt if pt calls the office.  

## 2020-09-11 ENCOUNTER — Ambulatory Visit (INDEPENDENT_AMBULATORY_CARE_PROVIDER_SITE_OTHER): Payer: Medicare HMO

## 2020-09-11 ENCOUNTER — Encounter: Payer: Self-pay | Admitting: Internal Medicine

## 2020-09-11 ENCOUNTER — Other Ambulatory Visit: Payer: Self-pay

## 2020-09-11 ENCOUNTER — Ambulatory Visit (INDEPENDENT_AMBULATORY_CARE_PROVIDER_SITE_OTHER): Payer: Medicare HMO | Admitting: Internal Medicine

## 2020-09-11 VITALS — BP 128/82 | HR 56 | Temp 97.7°F | Ht 73.0 in | Wt 234.0 lb

## 2020-09-11 DIAGNOSIS — E785 Hyperlipidemia, unspecified: Secondary | ICD-10-CM

## 2020-09-11 DIAGNOSIS — E782 Mixed hyperlipidemia: Secondary | ICD-10-CM

## 2020-09-11 DIAGNOSIS — M25561 Pain in right knee: Secondary | ICD-10-CM

## 2020-09-11 DIAGNOSIS — Z125 Encounter for screening for malignant neoplasm of prostate: Secondary | ICD-10-CM | POA: Diagnosis not present

## 2020-09-11 DIAGNOSIS — G8929 Other chronic pain: Secondary | ICD-10-CM

## 2020-09-11 DIAGNOSIS — R059 Cough, unspecified: Secondary | ICD-10-CM

## 2020-09-11 DIAGNOSIS — Z0000 Encounter for general adult medical examination without abnormal findings: Secondary | ICD-10-CM

## 2020-09-11 DIAGNOSIS — I2510 Atherosclerotic heart disease of native coronary artery without angina pectoris: Secondary | ICD-10-CM

## 2020-09-11 LAB — COMPREHENSIVE METABOLIC PANEL
ALT: 39 U/L (ref 0–53)
AST: 26 U/L (ref 0–37)
Albumin: 4.6 g/dL (ref 3.5–5.2)
Alkaline Phosphatase: 87 U/L (ref 39–117)
BUN: 27 mg/dL — ABNORMAL HIGH (ref 6–23)
CO2: 28 mEq/L (ref 19–32)
Calcium: 9.6 mg/dL (ref 8.4–10.5)
Chloride: 104 mEq/L (ref 96–112)
Creatinine, Ser: 1.1 mg/dL (ref 0.40–1.50)
GFR: 67.6 mL/min (ref >60.00)
Glucose, Bld: 87 mg/dL (ref 70–99)
Potassium: 4.4 mEq/L (ref 3.5–5.1)
Sodium: 138 mEq/L (ref 135–145)
Total Bilirubin: 1.5 mg/dL — ABNORMAL HIGH (ref 0.2–1.2)
Total Protein: 7 g/dL (ref 6.0–8.3)

## 2020-09-11 LAB — CBC WITH DIFFERENTIAL/PLATELET
Basophils Absolute: 0 10*3/uL (ref 0.0–0.1)
Basophils Relative: 0.3 % (ref 0.0–3.0)
Eosinophils Absolute: 0.2 10*3/uL (ref 0.0–0.7)
Eosinophils Relative: 2.4 % (ref 0.0–5.0)
HCT: 39.7 % (ref 39.0–52.0)
Hemoglobin: 13.5 g/dL (ref 13.0–17.0)
Lymphocytes Relative: 40.6 % (ref 12.0–46.0)
Lymphs Abs: 2.6 10*3/uL (ref 0.7–4.0)
MCHC: 34.1 g/dL (ref 30.0–36.0)
MCV: 89.1 fl (ref 78.0–100.0)
Monocytes Absolute: 0.5 10*3/uL (ref 0.1–1.0)
Monocytes Relative: 7.2 % (ref 3.0–12.0)
Neutro Abs: 3.2 10*3/uL (ref 1.4–7.7)
Neutrophils Relative %: 49.5 % (ref 43.0–77.0)
Platelets: 212 10*3/uL (ref 150.0–400.0)
RBC: 4.46 Mil/uL (ref 4.22–5.81)
RDW: 13.3 % (ref 11.5–15.5)
WBC: 6.4 10*3/uL (ref 4.0–10.5)

## 2020-09-11 LAB — URINALYSIS
Bilirubin Urine: NEGATIVE
Hgb urine dipstick: NEGATIVE
Ketones, ur: NEGATIVE
Leukocytes,Ua: NEGATIVE
Nitrite: NEGATIVE
Specific Gravity, Urine: >=1.03 — AB (ref 1.000–1.030)
Total Protein, Urine: NEGATIVE
Urine Glucose: NEGATIVE
Urobilinogen, UA: 0.2 (ref 0.0–1.0)
pH: 5 (ref 5.0–8.0)

## 2020-09-11 LAB — LIPID PANEL
Cholesterol: 135 mg/dL (ref 0–200)
HDL: 43.1 mg/dL (ref >39.00)
LDL Cholesterol: 75 mg/dL (ref 0–99)
NonHDL: 91.45
Total CHOL/HDL Ratio: 3
Triglycerides: 82 mg/dL (ref 0.0–149.0)
VLDL: 16.4 mg/dL (ref 0.0–40.0)

## 2020-09-11 LAB — PSA: PSA: 2.93 ng/mL (ref 0.10–4.00)

## 2020-09-11 LAB — TSH: TSH: 0.76 u[IU]/mL (ref 0.35–4.50)

## 2020-09-11 MED ORDER — METOPROLOL SUCCINATE ER 50 MG PO TB24: 50.0000 mg | ORAL_TABLET | Freq: Every day | ORAL | 3 refills | Status: DC

## 2020-09-11 MED ORDER — ATORVASTATIN CALCIUM 40 MG PO TABS: 40.0000 mg | ORAL_TABLET | Freq: Every day | ORAL | 3 refills | Status: DC

## 2020-09-11 MED ORDER — PANTOPRAZOLE SODIUM 40 MG PO TBEC: 40.0000 mg | DELAYED_RELEASE_TABLET | Freq: Every day | ORAL | 3 refills | Status: DC

## 2020-09-11 MED ORDER — NITROGLYCERIN 0.4 MG SL SUBL: 0.4000 mg | SUBLINGUAL_TABLET | SUBLINGUAL | 3 refills | Status: DC | PRN

## 2020-09-11 MED ORDER — BENZONATATE 200 MG PO CAPS: 200.0000 mg | ORAL_CAPSULE | Freq: Three times a day (TID) | ORAL | 0 refills | Status: DC | PRN

## 2020-09-11 NOTE — Progress Notes (Signed)
Subjective:  Patient ID: Alexander Hughes, male    DOB: 09-07-1950  Age: 70 y.o. MRN: [id number]  CC: Annual Exam   HPI Alexander Hughes presents for cough and fatigue  x 2 wks - better F/u CAD, dyslipidemia C/o R knee pain - not better   Outpatient Medications Prior to Visit  Medication Sig Dispense Refill  . aspirin EC 81 MG EC tablet Take 1 tablet (81 mg total) by mouth daily. 60 tablet 3  . atorvastatin (LIPITOR) 40 MG tablet Take 1 tablet (40 mg total) by mouth daily. 90 tablet 3  . Cholecalciferol (VITAMIN D3) 50 MCG (2000 UT) capsule Take 1 capsule (2,000 Units total) by mouth daily. 100 capsule 3  . metoprolol succinate (TOPROL XL) 50 MG 24 hr tablet Take 1 tablet (50 mg total) by mouth daily. Take with or immediately following a meal. 90 tablet 3  . pantoprazole (PROTONIX) 40 MG tablet Take 1 tablet (40 mg total) by mouth daily. 90 tablet 3  . nitroGLYCERIN (NITROSTAT) 0.4 MG SL tablet Place 1 tablet (0.4 mg total) under the tongue every 5 (five) minutes as needed for chest pain. 30 tablet 3  . triamcinolone ointment (KENALOG) 0.1 % Apply 1 application topically 2 (two) times daily. (Patient not taking: Reported on 09/11/2020) 80 g 2   No facility-administered medications prior to visit.    ROS: Review of Systems  Constitutional: Negative for appetite change, fatigue and unexpected weight change.  HENT: Negative for congestion, nosebleeds, sneezing, sore throat and trouble swallowing.   Eyes: Negative for itching and visual disturbance.  Respiratory: Positive for cough.   Cardiovascular: Negative for chest pain, palpitations and leg swelling.  Gastrointestinal: Negative for abdominal distention, blood in stool, diarrhea and nausea.  Genitourinary: Negative for frequency and hematuria.  Musculoskeletal: Negative for back pain, gait problem, joint swelling and neck pain.  Skin: Negative for rash.  Neurological: Negative for dizziness, tremors, speech difficulty and  weakness.  Psychiatric/Behavioral: Negative for agitation, dysphoric mood and sleep disturbance. The patient is not nervous/anxious.     Objective:  BP 128/82 (BP Location: Left Arm)   Pulse (!) 56   Temp 97.7 F (36.5 C) (Oral)   Ht 6\' 1"  (1.854 m)   Wt 234 lb (106.1 kg)   SpO2 98%   BMI 30.87 kg/m   BP Readings from Last 3 Encounters:  09/11/20 128/82  06/02/20 120/70  09/27/19 128/76    Wt Readings from Last 3 Encounters:  09/11/20 234 lb (106.1 kg)  06/02/20 230 lb (104.3 kg)  09/27/19 229 lb (103.9 kg)    Physical Exam Constitutional:      General: He is not in acute distress.    Appearance: He is well-developed.     Comments: NAD  Eyes:     Conjunctiva/sclera: Conjunctivae normal.     Pupils: Pupils are equal, round, and reactive to light.  Neck:     Thyroid: No thyromegaly.     Vascular: No JVD.  Cardiovascular:     Rate and Rhythm: Normal rate and regular rhythm.     Heart sounds: Normal heart sounds. No murmur heard. No friction rub. No gallop.   Pulmonary:     Effort: Pulmonary effort is normal. No respiratory distress.     Breath sounds: Normal breath sounds. No wheezing or rales.  Chest:     Chest wall: No tenderness.  Abdominal:     General: Bowel sounds are normal. There is no distension.     Palpations:  Abdomen is soft. There is no mass.     Tenderness: There is no abdominal tenderness. There is no guarding or rebound.  Musculoskeletal:        General: Tenderness present. Normal range of motion.     Cervical back: Normal range of motion.  Lymphadenopathy:     Cervical: No cervical adenopathy.  Skin:    General: Skin is warm and dry.     Findings: No rash.  Neurological:     Mental Status: He is alert and oriented to person, place, and time.     Cranial Nerves: No cranial nerve deficit.     Motor: No abnormal muscle tone.     Coordination: Coordination normal.     Gait: Gait normal.     Deep Tendon Reflexes: Reflexes are normal and  symmetric.  Psychiatric:        Behavior: Behavior normal.        Thought Content: Thought content normal.        Judgment: Judgment normal.     Lab Results  Component Value Date   WBC 5.8 07/04/2019   HGB 13.2 07/04/2019   HCT 38.4 (L) 07/04/2019   PLT 210.0 07/04/2019   GLUCOSE 96 07/04/2019   CHOL 165 03/09/2019   TRIG 156 (H) 03/09/2019   HDL 41 03/09/2019   LDLCALC 97 03/09/2019   ALT 33 07/04/2019   AST 25 07/04/2019   NA 138 07/04/2019   K 4.1 07/04/2019   CL 107 07/04/2019   CREATININE 0.96 07/04/2019   BUN 18 07/04/2019   CO2 26 07/04/2019   TSH 0.98 02/17/2017   PSA 2.91 02/17/2017   INR 1.0 08/25/2018   HGBA1C 5.4 08/26/2018    US Abdomen Complete  Result Date: 08/16/2019 CLINICAL DATA:  Left upper quadrant abdominal pain. EXAM: ABDOMEN ULTRASOUND COMPLETE COMPARISON:  None. FINDINGS: Gallbladder: No gallstones or wall thickening visualized. No sonographic Murphy sign noted by sonographer. Common bile duct: Diameter: 4 mm which is within normal limits. Liver: Increased echotexture of hepatic parenchyma is noted. 1 cm cyst is noted in right hepatic lobe. Portal vein is patent on color Doppler imaging with normal direction of blood flow towards the liver. IVC: No abnormality visualized. Pancreas: Visualized portion unremarkable. Spleen: Size and appearance within normal limits. Right Kidney: Length: 10.9 cm. Echogenicity within normal limits. No mass or hydronephrosis visualized. Left Kidney: Length: 12.3 cm. Echogenicity within normal limits. No mass or hydronephrosis visualized. Abdominal aorta: No aneurysm visualized. Other findings: None. IMPRESSION: Increased echogenicity of hepatic parenchyma is noted suggesting hepatic steatosis or other diffuse hepatocellular disease. No other significant abnormality seen in the abdomen. Electronically Signed   By: Lupita Raider M.D.   On: 08/16/2019 14:28    Assessment & Plan:    Follow-up: No follow-ups on file.  Sonda Primes, MD

## 2020-09-11 NOTE — Assessment & Plan Note (Signed)
On Lipitor 

## 2020-09-11 NOTE — Patient Instructions (Signed)
Turmeric and Black Cherry extract for the knee

## 2020-09-11 NOTE — Assessment & Plan Note (Signed)
CXR Tessalon prn  

## 2020-09-11 NOTE — Assessment & Plan Note (Addendum)
Turmeric and Black Cherry extract for the knee Ortho ref - Dr Charlann Boxer

## 2020-11-05 DIAGNOSIS — M1711 Unilateral primary osteoarthritis, right knee: Secondary | ICD-10-CM | POA: Diagnosis not present

## 2020-12-05 DIAGNOSIS — M1711 Unilateral primary osteoarthritis, right knee: Secondary | ICD-10-CM | POA: Diagnosis not present

## 2020-12-23 ENCOUNTER — Telehealth: Payer: Self-pay | Admitting: *Deleted

## 2020-12-23 NOTE — Telephone Encounter (Signed)
Rec'd medical clearance form from Dr. Durene Romans. Pt scheduled for knee replacement on 01/06/21. Pt needing medical clearance b4 surgery. Called pt to make appt there was no answer LMOM to call back and make medical clearance appt.Marland KitchenRaechel Chute

## 2020-12-23 NOTE — Progress Notes (Signed)
Sent message, via epic in basket, to Matthew Babish P.A.  requesting orders in epic from surgeon.  

## 2020-12-24 NOTE — Patient Instructions (Addendum)
DUE TO COVID-19 ONLY ONE VISITOR IS ALLOWED TO COME WITH YOU AND STAY IN THE WAITING ROOM ONLY DURING PRE OP AND PROCEDURE.   **NO VISITORS ARE ALLOWED IN THE SHORT STAY AREA OR RECOVERY ROOM!!**  IF YOU WILL BE ADMITTED INTO THE HOSPITAL YOU ARE ALLOWED ONLY TWO SUPPORT PEOPLE DURING VISITATION HOURS ONLY (10AM -8PM)   The support person(s) may change daily. The support person(s) must pass our screening, gel in and out, and wear a mask at all times, including in the patient's room. Patients must also wear a mask when staff or their support person are in the room.  No visitors under the age of 48. Any visitor under the age of 12 must be accompanied by an adult.    COVID SWAB TESTING MUST BE COMPLETED ON:  01/02/21 @ 10:00 AM   [address] [place], [place] [address]   [address]  [place] (backside of the building) You are not required to quarantine, however you are required to wear a well-fitted mask when you are out and around people not in your household.  Hand Hygiene often Do NOT share personal items Notify your provider if you are in close contact with someone who has COVID or you develop fever 100.4 or greater, new onset of sneezing, cough, sore throat, shortness of breath or body aches.   Your procedure is scheduled on: 01/06/21   Report to [facility] Main  Entrance   Report to Short Stay at 5:15 AM   [facility])   Call this number if you have problems the morning of surgery [phone]   Do not eat food :After Midnight.   May have liquids until  4:15 AM  day of surgery  CLEAR LIQUID DIET  Foods Allowed                                                                     Foods Excluded  Water, Black Coffee and tea, regular and decaf               liquids that you cannot  Plain Jell-O in any flavor  (No red)                                     see through such as: Fruit ices (not with fruit pulp)                                            milk,  soups, orange juice              Iced Popsicles (No red)                                                All solid food  Apple juices Sports drinks like Gatorade (No red) Lightly seasoned clear broth or consume(fat free) Sugar, honey syrup     Oral Hygiene is also important to reduce your risk of infection.                                    Remember - BRUSH YOUR TEETH THE MORNING OF SURGERY WITH YOUR REGULAR TOOTHPASTE   Take these medicines the morning of surgery with A SIP OF WATER: Atorvastatin, Metoprolol Succinate, Pantoprazole.                               You may not have any metal on your body including jewelry, and body piercing             Do not wear lotions, powders, cologne, or deodorant              Men may shave face and neck.   Do not bring valuables to the hospital. [facility] IS NOT             RESPONSIBLE   FOR VALUABLES.   Bring small overnight bag day of surgery.  Please read over the following fact sheets you were given: IF YOU HAVE QUESTIONS ABOUT YOUR PRE OP INSTRUCTIONS PLEASE CALL [phone]- [facility] - Preparing for Surgery Before surgery, you can play an important role.  Because skin is not sterile, your skin needs to be as free of germs as possible.  You can reduce the number of germs on your skin by washing with CHG (chlorahexidine gluconate) soap before surgery.  CHG is an antiseptic cleaner which kills germs and bonds with the skin to continue killing germs even after washing. Please DO NOT use if you have an allergy to CHG or antibacterial soaps.  If your skin becomes reddened/irritated stop using the CHG and inform your nurse when you arrive at Short Stay. Do not shave (including legs and underarms) for at least 48 hours prior to the first CHG shower.  You may shave your face/neck.  Please follow these instructions carefully:  1.  Shower with CHG Soap the night before surgery and the  morning of  surgery.  2.  If you choose to wash your hair, wash your hair first as usual with your normal  shampoo.  3.  After you shampoo, rinse your hair and body thoroughly to remove the shampoo.                             4.  Use CHG as you would any other liquid soap.  You can apply chg directly to the skin and wash.  Gently with a scrungie or clean washcloth.  5.  Apply the CHG Soap to your body ONLY FROM THE NECK DOWN.   Do   not use on face/ open                           Wound or open sores. Avoid contact with eyes, ears mouth and   genitals (private parts).                       Wash face,  Genitals (private parts) with your normal soap.  6.  Wash thoroughly, paying special attention to the area where your    surgery  will be performed.  7.  Thoroughly rinse your body with warm water from the neck down.  8.  DO NOT shower/wash with your normal soap after using and rinsing off the CHG Soap.                9.  Pat yourself dry with a clean towel.            10.  Wear clean pajamas.            11.  Place clean sheets on your bed the night of your first shower and do not  sleep with pets. Day of Surgery : Do not apply any lotions/deodorants the morning of surgery.  Please wear clean clothes to the hospital/surgery center.  FAILURE TO FOLLOW THESE INSTRUCTIONS MAY RESULT IN THE CANCELLATION OF YOUR SURGERY  PATIENT SIGNATURE_________________________________  NURSE SIGNATURE__________________________________  ________________________________________________________________________

## 2020-12-24 NOTE — Progress Notes (Addendum)
COVID Vaccine Completed: yes x3 Date COVID Vaccine completed: 08/10/19, 09/07/19 Has received booster: 04/11/20 COVID vaccine manufacturer: Pfizer    Moderna   Date of COVID positive in last 90 days: N/A  PCP - Jacinta Shoe, MD Cardiologist - seeing on [address] [place] Needs clearance  Chest x-ray - 09/11/20 Epic EKG - 12/25/20 Epic/chart Stress Test - N/a ECHO - 08/25/18 Epic Cardiac Cath - 08/25/18 Epic, stents Pacemaker/ICD device last checked: N/A Spinal Cord Stimulator: N/a  Sleep Study - N/A CPAP -   Fasting Blood Sugar - N/A Checks Blood Sugar _____ times a day  Blood Thinner Instructions: Aspirin Instructions: ASA 81, stop 1 week before Last Dose: 12/30/20  Activity level: Can go up a flight of stairs and perform activities of daily living without stopping and without symptoms of chest pain or shortness of breath.       Anesthesia review: HTN, CAD, NSTEMI  Patient denies shortness of breath, fever, cough and chest pain at PAT appointment   Patient verbalized understanding of instructions that were given to them at the PAT appointment. Patient was also instructed that they will need to review over the PAT instructions again at home before surgery.

## 2020-12-24 NOTE — Progress Notes (Signed)
Please place orders for PAT appointment scheduled 12/25/20.

## 2020-12-25 ENCOUNTER — Encounter (HOSPITAL_COMMUNITY): Payer: Self-pay

## 2020-12-25 ENCOUNTER — Encounter (HOSPITAL_COMMUNITY)
Admission: RE | Admit: 2020-12-25 | Discharge: 2020-12-25 | Disposition: A | Discharge status: Home / Self Care | Payer: Medicare HMO | Source: Ambulatory Visit | Attending: Orthopedic Surgery | Admitting: Orthopedic Surgery

## 2020-12-25 ENCOUNTER — Other Ambulatory Visit: Payer: Self-pay

## 2020-12-25 DIAGNOSIS — Z01818 Encounter for other preprocedural examination: Secondary | ICD-10-CM | POA: Insufficient documentation

## 2020-12-25 LAB — CBC
HCT: 37.4 % — ABNORMAL LOW (ref 39.0–52.0)
Hemoglobin: 12.6 g/dL — ABNORMAL LOW (ref 13.0–17.0)
MCH: 30.4 pg (ref 26.0–34.0)
MCHC: 33.7 g/dL (ref 30.0–36.0)
MCV: 90.1 fL (ref 80.0–100.0)
Platelets: 197 10*3/uL (ref 150–400)
RBC: 4.15 MIL/uL — ABNORMAL LOW (ref 4.22–5.81)
RDW: 12.5 % (ref 11.5–15.5)
WBC: 5.4 10*3/uL (ref 4.0–10.5)
nRBC: 0 % (ref 0.0–0.2)

## 2020-12-25 LAB — BASIC METABOLIC PANEL
Anion gap: 6 (ref 5–15)
BUN: 26 mg/dL — ABNORMAL HIGH (ref 8–23)
CO2: 26 mmol/L (ref 22–32)
Calcium: 9 mg/dL (ref 8.9–10.3)
Chloride: 108 mmol/L (ref 98–111)
Creatinine, Ser: 1.2 mg/dL (ref 0.61–1.24)
GFR, Estimated: >60 mL/min (ref >60)
Glucose, Bld: 98 mg/dL (ref 70–99)
Potassium: 4.6 mmol/L (ref 3.5–5.1)
Sodium: 140 mmol/L (ref 135–145)

## 2020-12-25 LAB — SURGICAL PCR SCREEN
MRSA, PCR: NEGATIVE
Staphylococcus aureus: NEGATIVE

## 2020-12-30 DIAGNOSIS — M1711 Unilateral primary osteoarthritis, right knee: Secondary | ICD-10-CM | POA: Insufficient documentation

## 2020-12-30 NOTE — Progress Notes (Addendum)
Subjective:   Alexander Hughes, male    DOB: 13-Oct-1950, 70 y.o.   MRN: [id number]   Chief complaint:  Coronary artery disease  HPI  70 y/o [place] American male with hypertension, CAD with NSTEMI 08/2019-OM1 PCI, TAA 4.3 cm.  I last saw the patient in 09/2019.  He has been doing well.  He stays active, without any complaints of chest pain, shortness of breath.  He is compliant with his medical therapy.  He has not undergone serial monitoring of his aortic root dilatation as recommended.  He is now having pain in his right knee, for which she is going to undergo right knee arthroplasty on 01/06/2021.  Blood pressure slightly elevated today.  Current Outpatient Medications on File Prior to Visit  Medication Sig Dispense Refill   aspirin EC 81 MG EC tablet Take 1 tablet (81 mg total) by mouth daily. 60 tablet 3   atorvastatin (LIPITOR) 40 MG tablet Take 1 tablet (40 mg total) by mouth daily. 90 tablet 3   benzonatate (TESSALON) 200 MG capsule Take 1 capsule (200 mg total) by mouth 3 (three) times daily as needed for cough. (Patient not taking: Reported on 12/22/2020) 30 capsule 0   Cholecalciferol (DIALYVITE VITAMIN D 5000) 125 MCG (5000 UT) capsule Take 5,000 Units by mouth daily.     Cholecalciferol (VITAMIN D3) 50 MCG (2000 UT) capsule Take 1 capsule (2,000 Units total) by mouth daily. (Patient not taking: Reported on 12/22/2020) 100 capsule 3   metoprolol succinate (TOPROL XL) 50 MG 24 hr tablet Take 1 tablet (50 mg total) by mouth daily. Take with or immediately following a meal. 90 tablet 3   nitroGLYCERIN (NITROSTAT) 0.4 MG SL tablet Place 1 tablet (0.4 mg total) under the tongue every 5 (five) minutes as needed for chest pain. 20 tablet 3   pantoprazole (PROTONIX) 40 MG tablet Take 1 tablet (40 mg total) by mouth daily. 90 tablet 3   No current facility-administered medications on file prior to visit.    Cardiac Studies:  EKG 12/31/2020: Sinus rhythm 51 bpm Probable old  anteroseptal infarct   CT Chest 08/10/2019: 1. Stable bilateral pulmonary nodules measuring up to 5 mm. No new or enlarging pulmonary nodules. A repeat CT in 6-12 months from today's scan is considered optional for low risk patients, but is recommended for high risk patients. This recommendation follows the consensus statement: Guidelines for Management of Incidental Pulmonary Nodules Detected on CT Images: From the Fleischner Society 2017; Radiology 2017; 284:228-243. 2. Ectasia of the ascending thoracic aorta measures 4.3 cm in diameter. Recommend annual imaging followup by CTA or MRA. This recommendation follows 2010 ACCF/AHA/AATS/ACR/ASA/SCA/SCAI/SIR/STS/SVM Guidelines for the Diagnosis and Management of Patients with Thoracic Aortic Disease. Circulation. 2010; 121: E100-F121. Aortic aneurysm NOS (ICD10-I71.9)    Coronary angiography and intervention 08/25/2018: LM: Normal LAD: Ostial-prox LAD 30% stenosis        Prox LAD 50% stenosis        Diag 1 ostial 30% stenosis LCx: Dominant vessel with prox-mid 20% stenosis        Large OM1 with 100% mid vessel occlusion. 50% ostial OM1 stenosis        Successful percutaneous coronary intervention         Aspiration thrombectomy, PTCA and stent placement        Synergy 3.0 X 20 mm drug-eluting stent mid OM1 RCA: Nondomiant vessel. No significant disease         DAPT for 1 year Aggressive medical management  for residual mild to moderate disease   EKG 08/26/2018: Sinus rhythm. Age indeterminate posterior infarct.  No acute ischemic change   Echocardiogram 08/25/2018: LVEF 60-65%. Grade 1 DD Normal RV function RVSP 21 mmhg Mild LA dilatation Moderate aortic root dilatation (4.2 cm) Ascending aorta dilatation 4.5 cm   Cath 08/25/2018: LM: Normal LAD: Ostial-prox LAD 30% stenosis        Prox LAD 50% stenosis        Diag 1 ostial 30% stenosis LCx: Dominant vessel with prox-mid 20% stenosis        Large OM1 with 100% mid vessel  occlusion. 50% ostial OM1 stenosis        Successful percutaneous coronary intervention         Aspiration thrombectomy, PTCA and stent placement        Synergy 3.0 X 20 mm drug-eluting stent mid OM1 RCA: Nondomiant vessel. No significant disease   DAPT for 1 year Aggressive medical management for residual mild to moderate disease   Recent labs: April-July 2022: Glucose 98, BUN/Cr 26/1/20. EGFR >60. Na/K 140/4.6.  T.bili 1.5 Rest of the CMP normal  H/H 12/27. MCV 90. Platelets 197 Chol 135, TG 82, HDL 43, LDL 75 TSH 0.76 normal  07/04/2019: Glucose 96, BUN/Cr 18/0.96. EGFR 77. Na/K 138/4.1. Rest of the CMP normal H/H 13/38. MCV 89. Platelets 210  Lipid panel: 03/09/2019: Chol 165, TG 156, HDL 41, LDL 97 08/25/2018: Chol 205, TG 101, HDL 45, LDL 140    Review of Systems  Cardiovascular:  Negative for chest pain, dyspnea on exertion, leg swelling, palpitations and syncope.  Musculoskeletal:  Positive for joint pain.        Vitals:   12/31/20 1025  BP: (!) 142/81  Pulse: (!) 56  Resp: 16  Temp: 98 F (36.7 C)  SpO2: 96%   Objective:   Physical Exam Vitals and nursing note reviewed.  Constitutional:      General: He is not in acute distress. Neck:     Vascular: No JVD.  Cardiovascular:     Rate and Rhythm: Normal rate and regular rhythm.     Heart sounds: Normal heart sounds. No murmur heard. Pulmonary:     Effort: Pulmonary effort is normal.     Breath sounds: Normal breath sounds. No wheezing or rales.  Musculoskeletal:     Right lower leg: No edema.     Left lower leg: No edema.          Assessment & Recommendations:    70 y/o [place] American male with hypertension, CAD with NSTEMI 08/2019-OM1 PCI, TAA 4.3 cm.  Preoperative risk stratification: Low cardiac risk for knee replacement surgery.  Okay to hold aspirin 5 days before surgery, if preferred by the surgeon.  Coronary artery disease without angina: Non-STEMI 08/2018.  Successful aspiration  thrombectomy and stent placement to large OM1 100% occlusion.  Synergy 3.0 x 20 mm DES. Mild to moderate residual coronary artery disease. No angina symptoms at this time. Continue Aspirin 81 mg daily, metoprolol succinate 50 mg daily. Cholesterol 135, HDL 43, LDL 75 on Lipitor 40 mg daily.  Continue the same.  Dilated ascending aorta/TAA: Ectatic ascending aorta measuring 4.3 cm. Recommend echocardiogram and CTA. Heart rate very well controlled.  Added losartan 25 mg daily for further blood pressure control and reduce further dilation of aorta.  Stable pulmonary nodule: This can also be followed up on CTA   F/u in 1 year  Nigel Mormon, MD [facility]  Pager: [phone] Office: [phone] If no answer Cell [phone]

## 2020-12-31 ENCOUNTER — Encounter: Payer: Self-pay | Admitting: Cardiology

## 2020-12-31 ENCOUNTER — Ambulatory Visit: Payer: Medicare HMO | Admitting: Cardiology

## 2020-12-31 ENCOUNTER — Other Ambulatory Visit: Payer: Self-pay

## 2020-12-31 VITALS — BP 142/81 | HR 56 | Temp 98.0°F | Resp 16 | Ht 73.0 in | Wt 234.0 lb

## 2020-12-31 DIAGNOSIS — I712 Thoracic aortic aneurysm, without rupture, unspecified: Secondary | ICD-10-CM | POA: Insufficient documentation

## 2020-12-31 DIAGNOSIS — M25561 Pain in right knee: Secondary | ICD-10-CM | POA: Diagnosis not present

## 2020-12-31 DIAGNOSIS — E782 Mixed hyperlipidemia: Secondary | ICD-10-CM | POA: Diagnosis not present

## 2020-12-31 DIAGNOSIS — I2510 Atherosclerotic heart disease of native coronary artery without angina pectoris: Secondary | ICD-10-CM

## 2020-12-31 DIAGNOSIS — M1711 Unilateral primary osteoarthritis, right knee: Secondary | ICD-10-CM | POA: Diagnosis not present

## 2020-12-31 DIAGNOSIS — Z01818 Encounter for other preprocedural examination: Secondary | ICD-10-CM | POA: Diagnosis not present

## 2020-12-31 DIAGNOSIS — I77810 Thoracic aortic ectasia: Secondary | ICD-10-CM

## 2020-12-31 MED ORDER — LOSARTAN POTASSIUM 25 MG PO TABS: 25.0000 mg | ORAL_TABLET | Freq: Every day | ORAL | 3 refills | Status: DC

## 2020-12-31 NOTE — Anesthesia Preprocedure Evaluation (Addendum)
Anesthesia Evaluation  Patient identified by MRN, date of birth, ID band Patient awake    Reviewed: Allergy & Precautions, H&P , NPO status , Patient's Chart, lab work & pertinent test results  Airway Mallampati: III  TM Distance: >3 FB Neck ROM: Full    Dental no notable dental hx. (+) Teeth Intact, Dental Advisory Given   Pulmonary neg pulmonary ROS,    Pulmonary exam normal breath sounds clear to auscultation       Cardiovascular Exercise Tolerance: Good hypertension, Pt. on medications and Pt. on home beta blockers + CAD and + Past MI   Rhythm:Regular Rate:Normal     Neuro/Psych negative neurological ROS  negative psych ROS   GI/Hepatic Neg liver ROS, GERD  Medicated,  Endo/Other  negative endocrine ROS  Renal/GU negative Renal ROS  negative genitourinary   Musculoskeletal  (+) Arthritis , Osteoarthritis,    Abdominal   Peds  Hematology negative hematology ROS (+)   Anesthesia Other Findings   Reproductive/Obstetrics negative OB ROS                           Anesthesia Physical Anesthesia Plan  ASA: 3  Anesthesia Plan: Spinal   Post-op Pain Management:  Regional for Post-op pain   Induction: Intravenous  PONV Risk Score and Plan: 2 and Ondansetron, Dexamethasone, Propofol infusion and Midazolam  Airway Management Planned: Simple Face Mask  Additional Equipment:   Intra-op Plan:   Post-operative Plan:   Informed Consent: I have reviewed the patients History and Physical, chart, labs and discussed the procedure including the risks, benefits and alternatives for the proposed anesthesia with the patient or authorized representative who has indicated his/her understanding and acceptance.     Dental advisory given  Plan Discussed with: CRNA  Anesthesia Plan Comments: (See PAT note 12/25/20, Jodell Cipro, PA-C)      Anesthesia Quick Evaluation

## 2020-12-31 NOTE — Progress Notes (Signed)
Anesthesia Chart Review   Case: [id number] Date/Time: 01/06/21 0700   Procedure: TOTAL KNEE ARTHROPLASTY (Right: Knee)   Anesthesia type: Spinal   Pre-op diagnosis: Right knee osteoarthritis   Location: [facility] ROOM 09 / [facility]   Surgeons: Durene Romans, MD       DISCUSSION:69 y.o. never smoker with h/o HTN, CAD (NSTEMI 08/2019-OM1 PCI), right knee OA scheduled for above procedure 01/06/2021 with Dr. Durene Romans.   Pt seen by cardiology 12/31/2020. Per OV note, "Low cardiac risk for knee replacement surgery.  Okay to hold aspirin 5 days before surgery, if preferred by the surgeon."  Anticipate pt can proceed with planned procedure barring acute status change.   VS: BP 140/85   Pulse (!) 54   Temp 36.5 C (Oral)   Resp 16   Ht 6\' 1"  (1.854 m)   Wt 105.9 kg   SpO2 97%   BMI 30.81 kg/m   PROVIDERS: Plotnikov, , MD is PCP   Georgina Quint, MD is Cardiologist  LABS: Labs reviewed: Acceptable for surgery. (all labs ordered are listed, but only abnormal results are displayed)  Labs Reviewed  BASIC METABOLIC PANEL - Abnormal; Notable for the following components:      Result Value   BUN 26 (*)    All other components within normal limits  CBC - Abnormal; Notable for the following components:   RBC 4.15 (*)    Hemoglobin 12.6 (*)    HCT 37.4 (*)    All other components within normal limits  SURGICAL PCR SCREEN     IMAGES:   EKG: 12/31/2020 Rate 51 bpm  sinus  CV: Cardiac Cath 08/25/2018 LM: Normal LAD: Ostial-prox LAD 30% stenosis        Prox LAD 50% stenosis        Diag 1 ostial 30% stenosis LCx: Dominant vessel with prox-mid 20% stenosis        Large OM1 with 100% mid vessel occlusion. 50% ostial OM1 stenosis        Successful percutaneous coronary intervention        Aspiration thrombectomy, PTCA and stent placement        Synergy 3.0 X 20 mm drug-eluting stent mid OM1 RCA: Nondomiant vessel. No significant disease  Echo 08/25/2018  1. The left  ventricle has normal systolic function with an ejection  fraction of 60-65%. The cavity size was normal. Left ventricular diastolic  Doppler parameters are consistent with impaired relaxation. No evidence of  left ventricular regional wall  motion abnormalities.   2. The right ventricle has normal systolic function. The cavity was  normal. There is no increase in right ventricular wall thickness. Right  ventricular systolic pressure normal with an estimated pressure of 20.8  mmHg.   3. Left atrial size was mildly dilated.   4. The aortic valve is tricuspid. Aortic valve regurgitation is mild by  color flow Doppler.   5. There is moderate dilatation of the aortic root and mild dilatation of  the ascending aorta. Consider further imaging such as CT or MR angiography  for further evaluation.   6. The mitral valve is grossly normal.   7. The tricuspid valve is grossly normal.  Past Medical History:  Diagnosis Date   Coronary artery disease    Elevated liver enzymes 09/06/2018   Hyperlipidemia    Hypertension    NSTEMI (non-ST elevated myocardial infarction) (HCC) 08/25/2018   08/24/2018 On Brilinta, aspirin, Lipitor, Toprol-XL Nitroglycerin as needed Status post PCI, found total  occlusion of OM1.  PTCA and stent placement in mid OM1.  Mild to moderate disease recommending medical management.  Patient will be on aspirin/Brilinta for 1 year.  Lipitor 20 mg for now in view of mild LFT elevation.    Unstable angina (HCC) 08/25/2018    Past Surgical History:  Procedure Laterality Date   CORONARY STENT INTERVENTION N/A 08/25/2018   Procedure: CORONARY STENT INTERVENTION;  Surgeon: Elder Negus, MD;  [facility];  Service: Cardiovascular;  Laterality: N/A;   CORONARY THROMBECTOMY N/A 08/25/2018   Procedure: Coronary Thrombectomy;  Surgeon: Elder Negus, MD;  Location: [facility];  Service: Cardiovascular;  Laterality: N/A;   LEFT HEART CATH AND CORONARY  ANGIOGRAPHY N/A 08/25/2018   Procedure: LEFT HEART CATH AND CORONARY ANGIOGRAPHY;  Surgeon: Elder Negus, MD;  Location: [facility];  Service: Cardiovascular;  Laterality: N/A;   right hand surgery      MEDICATIONS:  aspirin EC 81 MG EC tablet   atorvastatin (LIPITOR) 40 MG tablet   Cholecalciferol (DIALYVITE VITAMIN D 5000) 125 MCG (5000 UT) capsule   losartan (COZAAR) 25 MG tablet   metoprolol succinate (TOPROL XL) 50 MG 24 hr tablet   nitroGLYCERIN (NITROSTAT) 0.4 MG SL tablet   pantoprazole (PROTONIX) 40 MG tablet   No current facility-administered medications for this encounter.    Jodell Cipro, PA-C [facility] [phone]

## 2021-01-01 ENCOUNTER — Encounter: Payer: Self-pay | Admitting: Internal Medicine

## 2021-01-01 ENCOUNTER — Ambulatory Visit (INDEPENDENT_AMBULATORY_CARE_PROVIDER_SITE_OTHER): Payer: Medicare HMO | Admitting: Internal Medicine

## 2021-01-01 ENCOUNTER — Ambulatory Visit: Payer: Medicare HMO

## 2021-01-01 DIAGNOSIS — I2510 Atherosclerotic heart disease of native coronary artery without angina pectoris: Secondary | ICD-10-CM | POA: Diagnosis not present

## 2021-01-01 DIAGNOSIS — I77810 Thoracic aortic ectasia: Secondary | ICD-10-CM | POA: Diagnosis not present

## 2021-01-01 DIAGNOSIS — Z01810 Encounter for preprocedural cardiovascular examination: Secondary | ICD-10-CM

## 2021-01-01 NOTE — Patient Instructions (Signed)
We will get the papers sent in to be able to do the surgery.

## 2021-01-01 NOTE — H&P (Signed)
TOTAL KNEE ADMISSION H&P  Patient is being admitted for right total knee arthroplasty.  Subjective:  Chief Complaint:right knee pain.  HPI: Alexander Hughes, 70 y.o. male, has a history of pain and functional disability in the right knee due to arthritis and has failed non-surgical conservative treatments for greater than 12 weeks to includecorticosteriod injections and activity modification.  Onset of symptoms was gradual, starting 2 years ago with gradually worsening course since that time. The patient noted no past surgery on the right knee(s).  Patient currently rates pain in the right knee(s) at 8 out of 10 with activity. Patient has worsening of pain with activity and weight bearing and pain that interferes with activities of daily living.  Patient has evidence of joint space narrowing by imaging studies.  There is no active infection.  Patient Active Problem List   Diagnosis Date Noted   Thoracic aortic aneurysm without rupture (HCC) 12/31/2020   Pre-op evaluation 12/31/2020   Cough 09/11/2020   Knee pain, right 06/02/2020   Fatty liver 09/27/2019   Mixed hyperlipidemia 09/06/2019   Pulmonary nodules 07/04/2019   LUQ abdominal pain 07/04/2019   Aortic root dilatation (HCC) 03/08/2019   Bruising 10/03/2018   Coronary artery disease involving native coronary artery of native heart without angina pectoris 09/06/2018   H/O non-ST elevation myocardial infarction (NSTEMI) 09/06/2018   Dyslipidemia 08/31/2018   Elbow arthritis 03/15/2016   Helicobacter positive gastritis 01/25/2016   Well adult exam 01/19/2016   GERD (gastroesophageal reflux disease) 01/19/2016   HTN (hypertension) 01/19/2016   Past Medical History:  Diagnosis Date   Coronary artery disease    Elevated liver enzymes 09/06/2018   Hyperlipidemia    Hypertension    NSTEMI (non-ST elevated myocardial infarction) (HCC) 08/25/2018   08/24/2018 On Brilinta, aspirin, Lipitor, Toprol-XL Nitroglycerin as needed Status post  PCI, found total occlusion of OM1.  PTCA and stent placement in mid OM1.  Mild to moderate disease recommending medical management.  Patient will be on aspirin/Brilinta for 1 year.  Lipitor 20 mg for now in view of mild LFT elevation.    Unstable angina (HCC) 08/25/2018    Past Surgical History:  Procedure Laterality Date   CORONARY STENT INTERVENTION N/A 08/25/2018   Procedure: CORONARY STENT INTERVENTION;  Surgeon: Elder Negus, MD;  [facility];  Service: Cardiovascular;  Laterality: N/A;   CORONARY THROMBECTOMY N/A 08/25/2018   Procedure: Coronary Thrombectomy;  Surgeon: Elder Negus, MD;  [facility];  Service: Cardiovascular;  Laterality: N/A;   LEFT HEART CATH AND CORONARY ANGIOGRAPHY N/A 08/25/2018   Procedure: LEFT HEART CATH AND CORONARY ANGIOGRAPHY;  Surgeon: Elder Negus, MD;  [facility];  Service: Cardiovascular;  Laterality: N/A;   right hand surgery      No current facility-administered medications for this encounter.   Current Outpatient Medications  Medication Sig Dispense Refill Last Dose   aspirin EC 81 MG EC tablet Take 1 tablet (81 mg total) by mouth daily. 60 tablet 3    atorvastatin (LIPITOR) 40 MG tablet Take 1 tablet (40 mg total) by mouth daily. 90 tablet 3    Cholecalciferol (DIALYVITE VITAMIN D 5000) 125 MCG (5000 UT) capsule Take 5,000 Units by mouth daily.      metoprolol succinate (TOPROL XL) 50 MG 24 hr tablet Take 1 tablet (50 mg total) by mouth daily. Take with or immediately following a meal. 90 tablet 3    nitroGLYCERIN (NITROSTAT) 0.4 MG SL  tablet Place 1 tablet (0.4 mg total) under the tongue every 5 (five) minutes as needed for chest pain. 20 tablet 3    pantoprazole (PROTONIX) 40 MG tablet Take 1 tablet (40 mg total) by mouth daily. 90 tablet 3    losartan (COZAAR) 25 MG tablet Take 1 tablet (25 mg total) by mouth daily. 90 tablet 3    No Known Allergies  Social History    Tobacco Use   Smoking status: Never   Smokeless tobacco: Never  Substance Use Topics   Alcohol use: Yes    Comment: occassonally    Family History  Problem Relation Age of Onset   Hypertension Mother    Heart disease Mother    Hypertension Sister    Heart disease Brother        arrhythmia    Heart disease Brother    Hypertension Sister    Diabetes Mellitus II Neg Hx      Review of Systems  Constitutional:  Negative for chills and fever.  Respiratory:  Negative for cough and shortness of breath.   Cardiovascular:  Negative for chest pain.  Gastrointestinal:  Negative for nausea and vomiting.  Musculoskeletal:  Positive for arthralgias.   Objective:  Physical Exam Well nourished and well developed. General: Alert and oriented x3, cooperative and pleasant, no acute distress. Head: normocephalic, atraumatic, neck supple. Eyes: EOMI.  Musculoskeletal: Right knee exam: No palpable effusion, warmth erythema Genu varum Slight flexion contracture around 5 degrees or less Flexion to 120 degrees with some tightness Stable medial lateral collateral ligaments No significant groin pain or referred pain with hip flexion internal rotation. Neurovascular tact distally  Calves soft and nontender. Motor function intact in LE. Strength 5/5 LE bilaterally. Neuro: Distal pulses 2+. Sensation to light touch intact in LE.  Vital signs in last 24 hours: Temp:  [98.1 F (36.7 C)] 98.1 F (36.7 C) (07/28 0854) Pulse Rate:  [61] 61 (07/28 0854) Resp:  [18] 18 (07/28 0854) BP: (130)/(74) 130/74 (07/28 0854) SpO2:  [97 %] 97 % (07/28 0854) Weight:  [105.4 kg] 105.4 kg (07/28 0854)  Labs:   Estimated body mass index is 30.66 kg/m as calculated from the following:   Height as of 01/01/21: 6\' 1"  (1.854 m).   Weight as of 01/01/21: 105.4 kg.   Imaging Review Plain radiographs demonstrate severe degenerative joint disease of the right knee(s). The overall alignment isneutral. The  bone quality appears to be adequate for age and reported activity level.      Assessment/Plan:  End stage arthritis, right knee   The patient history, physical examination, clinical judgment of the provider and imaging studies are consistent with end stage degenerative joint disease of the right knee(s) and total knee arthroplasty is deemed medically necessary. The treatment options including medical management, injection therapy arthroscopy and arthroplasty were discussed at length. The risks and benefits of total knee arthroplasty were presented and reviewed. The risks due to aseptic loosening, infection, stiffness, patella tracking problems, thromboembolic complications and other imponderables were discussed. The patient acknowledged the explanation, agreed to proceed with the plan and consent was signed. Patient is being admitted for inpatient treatment for surgery, pain control, PT, OT, prophylactic antibiotics, VTE prophylaxis, progressive ambulation and ADL's and discharge planning. The patient is planning to be discharged  home.   Therapy Plans: outpatient therapy at [facility] Disposition: Home with wife Planned DVT Prophylaxis: aspirin 81mg  BID DME needed: none PCP: Dr. 01/03/21 , appointment on 7/27 Cardiologist: Dr.  Patwardhan , appointment on 7/27 TXA: IV Allergies: NKDA Anesthesia Concerns: none BMI: 31 Last HgbA1c: Not diabetic.  Other: - Hx of MI 3 years ago, s/p stenting - Norco, celebrex, robaxin  Interpreter: Mercie Eon [phone]  Patient's anticipated LOS is less than 2 midnights, meeting these requirements: - Younger than 29 - Lives within 1 hour of care - Has a competent adult at home to recover with post-op recover - NO history of  - Chronic pain requiring opiods  - Diabetes  - Coronary Artery Disease  - Heart failure  - Heart attack  - Stroke  - DVT/VTE  - Cardiac arrhythmia  - Respiratory Failure/COPD  - Renal failure  - Anemia  -  Advanced Liver disease  Dennie Bible, PA-C Orthopedic Surgery [facility] [place] [phone]

## 2021-01-01 NOTE — Progress Notes (Signed)
   Subjective:   Patient ID: Alexander Hughes, male    DOB: 01/05/1951, 70 y.o.   MRN: [id number]  HPI The patient is a 70 YO man coming in for surgical clearance for knee replacement scheduled for 01/06/21. Saw cardiologist yesterday and deemed low risk for procedure after EKG. They have ordered CTA and echo for monitoring of dilated aorta root. Denies anginal symptoms. Knee pain limits activity some.   Review of Systems  Constitutional: Negative.   HENT: Negative.    Eyes: Negative.   Respiratory:  Negative for cough, chest tightness and shortness of breath.   Cardiovascular:  Negative for chest pain, palpitations and leg swelling.  Gastrointestinal:  Negative for abdominal distention, abdominal pain, constipation, diarrhea, nausea and vomiting.  Musculoskeletal:  Positive for arthralgias.  Skin: Negative.   Neurological: Negative.   Psychiatric/Behavioral: Negative.     Objective:  Physical Exam Constitutional:      Appearance: He is well-developed.  HENT:     Head: Normocephalic and atraumatic.  Cardiovascular:     Rate and Rhythm: Normal rate and regular rhythm.  Pulmonary:     Effort: Pulmonary effort is normal. No respiratory distress.     Breath sounds: Normal breath sounds. No wheezing or rales.  Abdominal:     General: Bowel sounds are normal. There is no distension.     Palpations: Abdomen is soft.     Tenderness: There is no abdominal tenderness. There is no rebound.  Musculoskeletal:        General: Tenderness present.     Cervical back: Normal range of motion.  Skin:    General: Skin is warm and dry.  Neurological:     Mental Status: He is alert and oriented to person, place, and time.     Coordination: Coordination normal.    Vitals:   01/01/21 0854  BP: 130/74  Pulse: 61  Resp: 18  Temp: 98.1 F (36.7 C)  TempSrc: Oral  SpO2: 97%  Weight: 232 lb 6.4 oz (105.4 kg)  Height: 6\' 1"  (1.854 m)    This visit occurred during the SARS-CoV-2 public health  emergency.  Safety protocols were in place, including screening questions prior to the visit, additional usage of staff PPE, and extensive cleaning of exam room while observing appropriate contact time as indicated for disinfecting solutions.   Assessment & Plan:

## 2021-01-02 ENCOUNTER — Other Ambulatory Visit (HOSPITAL_COMMUNITY)
Admission: RE | Admit: 2021-01-02 | Discharge: 2021-01-02 | Disposition: A | Discharge status: Home / Self Care | Payer: Medicare HMO | Source: Ambulatory Visit | Attending: Orthopedic Surgery | Admitting: Orthopedic Surgery

## 2021-01-02 DIAGNOSIS — Z20822 Contact with and (suspected) exposure to covid-19: Secondary | ICD-10-CM | POA: Diagnosis not present

## 2021-01-02 DIAGNOSIS — Z01812 Encounter for preprocedural laboratory examination: Secondary | ICD-10-CM | POA: Diagnosis not present

## 2021-01-02 DIAGNOSIS — Z01810 Encounter for preprocedural cardiovascular examination: Secondary | ICD-10-CM | POA: Insufficient documentation

## 2021-01-02 LAB — SARS CORONAVIRUS 2 (TAT 6-24 HRS): SARS Coronavirus 2: NEGATIVE

## 2021-01-02 NOTE — Assessment & Plan Note (Signed)
Reviewed recent cardiology note and EKG today with patient. He does not have any new chest pains or SOB since that visit. Reasonable for low risk for knee replacement surgery. I am hopeful he will be able to resume his prior higher level of activity. Counseled on risk for constipation in perioperative period due to opioids and need for preventative bowel regimen while on opioids.

## 2021-01-04 NOTE — Progress Notes (Signed)
Normal pumping function of the heart. Mild to moderate leakiness of 3/4 valves, clinically not significant. Aorta dilated 4.4 cm, stable from before. Will compare with CT scan. Just need to monitor with once a year echocardiogram/CT scan.   Can go ahead with knee surgery.  Thanks MJP

## 2021-01-05 NOTE — Progress Notes (Signed)
Pt called back. Spoke to pt regarding echo results. Pt voiced understanding.

## 2021-01-05 NOTE — Progress Notes (Signed)
Called patient, NA, LMAM

## 2021-01-06 ENCOUNTER — Ambulatory Visit (HOSPITAL_COMMUNITY): Payer: Medicare HMO | Admitting: Physician Assistant

## 2021-01-06 ENCOUNTER — Encounter (HOSPITAL_COMMUNITY): Payer: Self-pay | Admitting: Orthopedic Surgery

## 2021-01-06 ENCOUNTER — Ambulatory Visit (HOSPITAL_COMMUNITY): Payer: Medicare HMO | Admitting: Certified Registered Nurse Anesthetist

## 2021-01-06 ENCOUNTER — Other Ambulatory Visit: Payer: Self-pay

## 2021-01-06 ENCOUNTER — Encounter (HOSPITAL_COMMUNITY)
Admission: RE | Disposition: A | Discharge status: Home / Self Care | Payer: Self-pay | Source: Home / Self Care | Attending: Orthopedic Surgery

## 2021-01-06 ENCOUNTER — Observation Stay (HOSPITAL_COMMUNITY)
Admission: RE | Admit: 2021-01-06 | Discharge: 2021-01-07 | Disposition: A | Discharge status: Home / Self Care | Payer: Medicare HMO | Attending: Orthopedic Surgery | Admitting: Orthopedic Surgery

## 2021-01-06 DIAGNOSIS — I10 Essential (primary) hypertension: Secondary | ICD-10-CM | POA: Insufficient documentation

## 2021-01-06 DIAGNOSIS — Z79899 Other long term (current) drug therapy: Secondary | ICD-10-CM | POA: Insufficient documentation

## 2021-01-06 DIAGNOSIS — I2510 Atherosclerotic heart disease of native coronary artery without angina pectoris: Secondary | ICD-10-CM | POA: Diagnosis not present

## 2021-01-06 DIAGNOSIS — M1711 Unilateral primary osteoarthritis, right knee: Principal | ICD-10-CM | POA: Insufficient documentation

## 2021-01-06 DIAGNOSIS — G8918 Other acute postprocedural pain: Secondary | ICD-10-CM | POA: Diagnosis not present

## 2021-01-06 DIAGNOSIS — E782 Mixed hyperlipidemia: Secondary | ICD-10-CM | POA: Diagnosis not present

## 2021-01-06 DIAGNOSIS — Z96651 Presence of right artificial knee joint: Secondary | ICD-10-CM

## 2021-01-06 DIAGNOSIS — Z7982 Long term (current) use of aspirin: Secondary | ICD-10-CM | POA: Insufficient documentation

## 2021-01-06 DIAGNOSIS — I252 Old myocardial infarction: Secondary | ICD-10-CM | POA: Diagnosis not present

## 2021-01-06 HISTORY — PX: TOTAL KNEE ARTHROPLASTY: SHX125

## 2021-01-06 LAB — COMPREHENSIVE METABOLIC PANEL
ALT: 34 U/L (ref 0–44)
AST: 26 U/L (ref 15–41)
Albumin: 3.8 g/dL (ref 3.5–5.0)
Alkaline Phosphatase: 71 U/L (ref 38–126)
Anion gap: 9 (ref 5–15)
BUN: 23 mg/dL (ref 8–23)
CO2: 24 mmol/L (ref 22–32)
Calcium: 8.8 mg/dL — ABNORMAL LOW (ref 8.9–10.3)
Chloride: 104 mmol/L (ref 98–111)
Creatinine, Ser: 1.11 mg/dL (ref 0.61–1.24)
GFR, Estimated: >60 mL/min (ref >60)
Glucose, Bld: 115 mg/dL — ABNORMAL HIGH (ref 70–99)
Potassium: 4.5 mmol/L (ref 3.5–5.1)
Sodium: 137 mmol/L (ref 135–145)
Total Bilirubin: 0.9 mg/dL (ref 0.3–1.2)
Total Protein: 6.5 g/dL (ref 6.5–8.1)

## 2021-01-06 LAB — ABO/RH: ABO/RH(D): A POS

## 2021-01-06 LAB — CBC
HCT: 35.4 % — ABNORMAL LOW (ref 39.0–52.0)
Hemoglobin: 11.7 g/dL — ABNORMAL LOW (ref 13.0–17.0)
MCH: 30.4 pg (ref 26.0–34.0)
MCHC: 33.1 g/dL (ref 30.0–36.0)
MCV: 91.9 fL (ref 80.0–100.0)
Platelets: 165 10*3/uL (ref 150–400)
RBC: 3.85 MIL/uL — ABNORMAL LOW (ref 4.22–5.81)
RDW: 12.8 % (ref 11.5–15.5)
WBC: 6 10*3/uL (ref 4.0–10.5)
nRBC: 0 % (ref 0.0–0.2)

## 2021-01-06 LAB — TYPE AND SCREEN
ABO/RH(D): A POS
Antibody Screen: NEGATIVE

## 2021-01-06 SURGERY — ARTHROPLASTY, KNEE, TOTAL
Anesthesia: Spinal | Site: Knee | Laterality: Right

## 2021-01-06 MED ORDER — ASPIRIN 81 MG PO CHEW
81.0000 mg | CHEWABLE_TABLET | Freq: Two times a day (BID) | ORAL | Status: DC
  Administered 2021-01-06 – 2021-01-07 (×2): 81 mg via ORAL
  Filled 2021-01-06 (×2): qty 1

## 2021-01-06 MED ORDER — MORPHINE SULFATE (PF) 4 MG/ML IV SOLN: 0.5000 mg | INTRAVENOUS | Status: DC | PRN

## 2021-01-06 MED ORDER — METOCLOPRAMIDE HCL 5 MG PO TABS: 5.0000 mg | ORAL_TABLET | Freq: Three times a day (TID) | ORAL | Status: DC | PRN

## 2021-01-06 MED ORDER — BUPIVACAINE-EPINEPHRINE (PF) 0.5% -1:200000 IJ SOLN
INTRAMUSCULAR | Status: DC | PRN
  Administered 2021-01-06: 20 mL via PERINEURAL

## 2021-01-06 MED ORDER — ORAL CARE MOUTH RINSE: 15.0000 mL | Freq: Once | OROMUCOSAL | Status: AC

## 2021-01-06 MED ORDER — POVIDONE-IODINE 10 % EX SWAB
2.0000 "application " | Freq: Once | CUTANEOUS | Status: AC
  Administered 2021-01-06: 2 via TOPICAL

## 2021-01-06 MED ORDER — DEXAMETHASONE SODIUM PHOSPHATE 10 MG/ML IJ SOLN
INTRAMUSCULAR | Status: AC
  Filled 2021-01-06: qty 1

## 2021-01-06 MED ORDER — ATORVASTATIN CALCIUM 40 MG PO TABS
40.0000 mg | ORAL_TABLET | Freq: Every day | ORAL | Status: DC
  Administered 2021-01-07: 40 mg via ORAL
  Filled 2021-01-06 (×2): qty 1

## 2021-01-06 MED ORDER — SODIUM CHLORIDE (PF) 0.9 % IJ SOLN
INTRAMUSCULAR | Status: AC
  Filled 2021-01-06: qty 30

## 2021-01-06 MED ORDER — HYDROMORPHONE HCL 1 MG/ML IJ SOLN
INTRAMUSCULAR | Status: AC
  Administered 2021-01-06: 0.5 mg via INTRAVENOUS
  Filled 2021-01-06: qty 1

## 2021-01-06 MED ORDER — SODIUM CHLORIDE (PF) 0.9 % IJ SOLN
INTRAMUSCULAR | Status: DC | PRN
  Administered 2021-01-06: 30 mL

## 2021-01-06 MED ORDER — HYDROCODONE-ACETAMINOPHEN 5-325 MG PO TABS
ORAL_TABLET | ORAL | Status: AC
  Filled 2021-01-06: qty 1

## 2021-01-06 MED ORDER — CEFAZOLIN SODIUM-DEXTROSE 2-4 GM/100ML-% IV SOLN
INTRAVENOUS | Status: AC
  Filled 2021-01-06: qty 100

## 2021-01-06 MED ORDER — DEXAMETHASONE SODIUM PHOSPHATE 10 MG/ML IJ SOLN
8.0000 mg | Freq: Once | INTRAMUSCULAR | Status: AC
  Administered 2021-01-06: 8 mg via INTRAVENOUS

## 2021-01-06 MED ORDER — CHLORHEXIDINE GLUCONATE 0.12 % MT SOLN
15.0000 mL | Freq: Once | OROMUCOSAL | Status: AC
  Administered 2021-01-06: 15 mL via OROMUCOSAL

## 2021-01-06 MED ORDER — ACETAMINOPHEN 10 MG/ML IV SOLN
INTRAVENOUS | Status: DC | PRN
  Administered 2021-01-06: 1000 mg via INTRAVENOUS

## 2021-01-06 MED ORDER — EPHEDRINE 5 MG/ML INJ
INTRAVENOUS | Status: AC
  Filled 2021-01-06: qty 5

## 2021-01-06 MED ORDER — BUPIVACAINE-EPINEPHRINE (PF) 0.25% -1:200000 IJ SOLN
INTRAMUSCULAR | Status: DC | PRN
  Administered 2021-01-06: 30 mL

## 2021-01-06 MED ORDER — DOCUSATE SODIUM 100 MG PO CAPS
100.0000 mg | ORAL_CAPSULE | Freq: Two times a day (BID) | ORAL | Status: DC
  Administered 2021-01-06 – 2021-01-07 (×2): 100 mg via ORAL
  Filled 2021-01-06 (×2): qty 1

## 2021-01-06 MED ORDER — MIDAZOLAM HCL 2 MG/2ML IJ SOLN
INTRAMUSCULAR | Status: DC | PRN
  Administered 2021-01-06 (×2): 1 mg via INTRAVENOUS

## 2021-01-06 MED ORDER — MIDAZOLAM HCL 2 MG/2ML IJ SOLN
INTRAMUSCULAR | Status: AC
  Filled 2021-01-06: qty 2

## 2021-01-06 MED ORDER — PROPOFOL 10 MG/ML IV BOLUS
INTRAVENOUS | Status: DC | PRN
  Administered 2021-01-06: 30 mg via INTRAVENOUS

## 2021-01-06 MED ORDER — METOCLOPRAMIDE HCL 5 MG/ML IJ SOLN: 5.0000 mg | Freq: Three times a day (TID) | INTRAMUSCULAR | Status: DC | PRN

## 2021-01-06 MED ORDER — METHOCARBAMOL 500 MG PO TABS: 500.0000 mg | ORAL_TABLET | Freq: Four times a day (QID) | ORAL | Status: DC | PRN

## 2021-01-06 MED ORDER — FERROUS SULFATE 325 (65 FE) MG PO TABS
325.0000 mg | ORAL_TABLET | Freq: Three times a day (TID) | ORAL | Status: DC
  Administered 2021-01-07: 325 mg via ORAL
  Filled 2021-01-06: qty 1

## 2021-01-06 MED ORDER — PANTOPRAZOLE SODIUM 40 MG PO TBEC
40.0000 mg | DELAYED_RELEASE_TABLET | Freq: Every day | ORAL | Status: DC
  Administered 2021-01-07: 40 mg via ORAL
  Filled 2021-01-06 (×2): qty 1

## 2021-01-06 MED ORDER — HYDROCODONE-ACETAMINOPHEN 7.5-325 MG PO TABS: 1.0000 | ORAL_TABLET | ORAL | Status: DC | PRN

## 2021-01-06 MED ORDER — ACETAMINOPHEN 10 MG/ML IV SOLN
INTRAVENOUS | Status: AC
  Filled 2021-01-06: qty 100

## 2021-01-06 MED ORDER — ONDANSETRON HCL 4 MG PO TABS
4.0000 mg | ORAL_TABLET | Freq: Four times a day (QID) | ORAL | Status: DC | PRN
  Filled 2021-01-06: qty 1

## 2021-01-06 MED ORDER — PROPOFOL 10 MG/ML IV BOLUS
INTRAVENOUS | Status: AC
  Filled 2021-01-06: qty 20

## 2021-01-06 MED ORDER — DIPHENHYDRAMINE HCL 12.5 MG/5ML PO ELIX: 12.5000 mg | ORAL_SOLUTION | ORAL | Status: DC | PRN

## 2021-01-06 MED ORDER — HYDROMORPHONE HCL 1 MG/ML IJ SOLN
0.2500 mg | INTRAMUSCULAR | Status: DC | PRN
  Administered 2021-01-06: 0.5 mg via INTRAVENOUS

## 2021-01-06 MED ORDER — TRANEXAMIC ACID-NACL 1000-0.7 MG/100ML-% IV SOLN
1000.0000 mg | INTRAVENOUS | Status: AC
  Administered 2021-01-06: 1000 mg via INTRAVENOUS
  Filled 2021-01-06: qty 100

## 2021-01-06 MED ORDER — BISACODYL 10 MG RE SUPP: 10.0000 mg | Freq: Every day | RECTAL | Status: DC | PRN

## 2021-01-06 MED ORDER — MENTHOL 3 MG MT LOZG: 1.0000 | LOZENGE | OROMUCOSAL | Status: DC | PRN

## 2021-01-06 MED ORDER — PHENOL 1.4 % MT LIQD: 1.0000 | OROMUCOSAL | Status: DC | PRN

## 2021-01-06 MED ORDER — EPHEDRINE SULFATE-NACL 50-0.9 MG/10ML-% IV SOSY
PREFILLED_SYRINGE | INTRAVENOUS | Status: DC | PRN
  Administered 2021-01-06: 5 mg via INTRAVENOUS
  Administered 2021-01-06 (×2): 10 mg via INTRAVENOUS

## 2021-01-06 MED ORDER — STERILE WATER FOR IRRIGATION IR SOLN
Status: DC | PRN
  Administered 2021-01-06: 2000 mL

## 2021-01-06 MED ORDER — HYDROCODONE-ACETAMINOPHEN 5-325 MG PO TABS
1.0000 | ORAL_TABLET | ORAL | Status: DC | PRN
  Administered 2021-01-06: 1 via ORAL

## 2021-01-06 MED ORDER — CELECOXIB 200 MG PO CAPS
200.0000 mg | ORAL_CAPSULE | Freq: Two times a day (BID) | ORAL | Status: DC
  Administered 2021-01-06 – 2021-01-07 (×2): 200 mg via ORAL
  Filled 2021-01-06 (×2): qty 1

## 2021-01-06 MED ORDER — TRANEXAMIC ACID-NACL 1000-0.7 MG/100ML-% IV SOLN
1000.0000 mg | Freq: Once | INTRAVENOUS | Status: AC
  Administered 2021-01-06: 1000 mg via INTRAVENOUS

## 2021-01-06 MED ORDER — ONDANSETRON HCL 4 MG/2ML IJ SOLN: 4.0000 mg | Freq: Four times a day (QID) | INTRAMUSCULAR | Status: DC | PRN

## 2021-01-06 MED ORDER — CEFAZOLIN SODIUM-DEXTROSE 2-4 GM/100ML-% IV SOLN
2.0000 g | Freq: Four times a day (QID) | INTRAVENOUS | Status: AC
  Administered 2021-01-06 (×2): 2 g via INTRAVENOUS
  Filled 2021-01-06: qty 100

## 2021-01-06 MED ORDER — TRANEXAMIC ACID-NACL 1000-0.7 MG/100ML-% IV SOLN
INTRAVENOUS | Status: AC
  Filled 2021-01-06: qty 100

## 2021-01-06 MED ORDER — FENTANYL CITRATE (PF) 100 MCG/2ML IJ SOLN
INTRAMUSCULAR | Status: DC | PRN
  Administered 2021-01-06 (×2): 50 ug via INTRAVENOUS

## 2021-01-06 MED ORDER — METOPROLOL SUCCINATE ER 50 MG PO TB24
50.0000 mg | ORAL_TABLET | Freq: Every day | ORAL | Status: DC
  Administered 2021-01-07: 50 mg via ORAL
  Filled 2021-01-06 (×2): qty 1

## 2021-01-06 MED ORDER — NITROGLYCERIN 0.4 MG SL SUBL: 0.4000 mg | SUBLINGUAL_TABLET | SUBLINGUAL | Status: DC | PRN

## 2021-01-06 MED ORDER — 0.9 % SODIUM CHLORIDE (POUR BTL) OPTIME
TOPICAL | Status: DC | PRN
  Administered 2021-01-06: 1000 mL

## 2021-01-06 MED ORDER — ACETAMINOPHEN 325 MG PO TABS: 325.0000 mg | ORAL_TABLET | Freq: Four times a day (QID) | ORAL | Status: DC | PRN

## 2021-01-06 MED ORDER — BUPIVACAINE IN DEXTROSE 0.75-8.25 % IT SOLN
INTRATHECAL | Status: DC | PRN
  Administered 2021-01-06: 1.6 mL via INTRATHECAL

## 2021-01-06 MED ORDER — LACTATED RINGERS IV SOLN: INTRAVENOUS | Status: DC

## 2021-01-06 MED ORDER — CEFAZOLIN SODIUM-DEXTROSE 2-4 GM/100ML-% IV SOLN
2.0000 g | INTRAVENOUS | Status: AC
  Administered 2021-01-06: 2 g via INTRAVENOUS
  Filled 2021-01-06: qty 100

## 2021-01-06 MED ORDER — FENTANYL CITRATE (PF) 100 MCG/2ML IJ SOLN
INTRAMUSCULAR | Status: AC
  Filled 2021-01-06: qty 2

## 2021-01-06 MED ORDER — LOSARTAN POTASSIUM 25 MG PO TABS
25.0000 mg | ORAL_TABLET | Freq: Every day | ORAL | Status: DC
  Administered 2021-01-07: 25 mg via ORAL
  Filled 2021-01-06: qty 1

## 2021-01-06 MED ORDER — ONDANSETRON HCL 4 MG/2ML IJ SOLN
INTRAMUSCULAR | Status: DC | PRN
  Administered 2021-01-06: 4 mg via INTRAVENOUS

## 2021-01-06 MED ORDER — ONDANSETRON HCL 4 MG/2ML IJ SOLN
INTRAMUSCULAR | Status: AC
  Filled 2021-01-06: qty 2

## 2021-01-06 MED ORDER — BUPIVACAINE-EPINEPHRINE (PF) 0.25% -1:200000 IJ SOLN
INTRAMUSCULAR | Status: AC
  Filled 2021-01-06: qty 30

## 2021-01-06 MED ORDER — KETOROLAC TROMETHAMINE 30 MG/ML IJ SOLN
INTRAMUSCULAR | Status: DC | PRN
  Administered 2021-01-06: 30 mg

## 2021-01-06 MED ORDER — METHOCARBAMOL 1000 MG/10ML IJ SOLN
500.0000 mg | Freq: Four times a day (QID) | INTRAVENOUS | Status: DC | PRN
  Filled 2021-01-06: qty 5

## 2021-01-06 MED ORDER — POLYETHYLENE GLYCOL 3350 17 G PO PACK: 17.0000 g | PACK | Freq: Every day | ORAL | Status: DC | PRN

## 2021-01-06 MED ORDER — KETOROLAC TROMETHAMINE 30 MG/ML IJ SOLN
INTRAMUSCULAR | Status: AC
  Filled 2021-01-06: qty 1

## 2021-01-06 MED ORDER — PROPOFOL 500 MG/50ML IV EMUL
INTRAVENOUS | Status: DC | PRN
  Administered 2021-01-06: 75 ug/kg/min via INTRAVENOUS

## 2021-01-06 MED ORDER — PROPOFOL 500 MG/50ML IV EMUL
INTRAVENOUS | Status: AC
  Filled 2021-01-06: qty 50

## 2021-01-06 MED ORDER — SODIUM CHLORIDE 0.9 % IR SOLN
Status: DC | PRN
  Administered 2021-01-06: 1000 mL

## 2021-01-06 MED ORDER — SODIUM CHLORIDE 0.9 % IV SOLN: INTRAVENOUS | Status: DC

## 2021-01-06 MED ORDER — DEXAMETHASONE SODIUM PHOSPHATE 10 MG/ML IJ SOLN: 10.0000 mg | Freq: Once | INTRAMUSCULAR | Status: DC

## 2021-01-06 SURGICAL SUPPLY — 53 items
ATTUNE MED ANAT PAT 41 KNEE (Knees) ×2 IMPLANT
ATTUNE PS FEM RT SZ9 CEM KNEE (Femur) ×2 IMPLANT
BAG COUNTER SPONGE SURGICOUNT (BAG) ×2 IMPLANT
BAG ZIPLOCK 12X15 (MISCELLANEOUS) IMPLANT
BASE TIBIAL ATTUNE KNEE SZ9 (Knees) ×1 IMPLANT
BLADE SAW SGTL 11.0X1.19X90.0M (BLADE) IMPLANT
BLADE SAW SGTL 13.0X1.19X90.0M (BLADE) ×2 IMPLANT
BLADE SURG SZ10 CARB STEEL (BLADE) ×4 IMPLANT
BNDG ELASTIC 6X5.8 VLCR STR LF (GAUZE/BANDAGES/DRESSINGS) ×2 IMPLANT
BOWL SMART MIX CTS (DISPOSABLE) ×2 IMPLANT
CEMENT HV SMART SET (Cement) ×4 IMPLANT
CUFF TOURN SGL QUICK 34 (TOURNIQUET CUFF) ×1
CUFF TRNQT CYL 34X4.125X (TOURNIQUET CUFF) ×1 IMPLANT
DECANTER SPIKE VIAL GLASS SM (MISCELLANEOUS) ×8 IMPLANT
DERMABOND ADVANCED (GAUZE/BANDAGES/DRESSINGS) ×1
DERMABOND ADVANCED .7 DNX12 (GAUZE/BANDAGES/DRESSINGS) ×1 IMPLANT
DRAPE U-SHAPE 47X51 STRL (DRAPES) ×2 IMPLANT
DRESSING AQUACEL AG SP 3.5X10 (GAUZE/BANDAGES/DRESSINGS) ×1 IMPLANT
DRSG AQUACEL AG SP 3.5X10 (GAUZE/BANDAGES/DRESSINGS) ×2
DURAPREP 26ML APPLICATOR (WOUND CARE) ×4 IMPLANT
ELECT REM PT RETURN 15FT ADLT (MISCELLANEOUS) ×2 IMPLANT
GLOVE SURG ENC MOIS LTX SZ6 (GLOVE) IMPLANT
GLOVE SURG UNDER LTX SZ7.5 (GLOVE) ×2 IMPLANT
GLOVE SURG UNDER POLY LF SZ6.5 (GLOVE) IMPLANT
GLOVE SURG UNDER POLY LF SZ7.5 (GLOVE) ×2 IMPLANT
GOWN STRL REUS W/TWL LRG LVL3 (GOWN DISPOSABLE) ×2 IMPLANT
HANDPIECE INTERPULSE COAX TIP (DISPOSABLE) ×1
HOLDER FOLEY CATH W/STRAP (MISCELLANEOUS) ×2 IMPLANT
INSERT TIBIAL ATTUNE SZ9 5MM (Insert) ×2 IMPLANT
KIT TURNOVER KIT A (KITS) ×2 IMPLANT
MANIFOLD NEPTUNE II (INSTRUMENTS) ×2 IMPLANT
NDL SAFETY ECLIPSE 18X1.5 (NEEDLE) ×1 IMPLANT
NEEDLE HYPO 18GX1.5 SHARP (NEEDLE) ×1
NS IRRIG 1000ML POUR BTL (IV SOLUTION) ×2 IMPLANT
PACK TOTAL KNEE CUSTOM (KITS) ×2 IMPLANT
PENCIL SMOKE EVACUATOR (MISCELLANEOUS) IMPLANT
PIN DRILL FIX HALF THREAD (BIT) ×2 IMPLANT
PIN FIX SIGMA LCS THRD HI (PIN) ×2 IMPLANT
PROTECTOR NERVE ULNAR (MISCELLANEOUS) ×2 IMPLANT
SET HNDPC FAN SPRY TIP SCT (DISPOSABLE) ×1 IMPLANT
SET PAD KNEE POSITIONER (MISCELLANEOUS) ×2 IMPLANT
SPONGE T-LAP 18X18 ~~LOC~~+RFID (SPONGE) ×6 IMPLANT
SUT MNCRL AB 4-0 PS2 18 (SUTURE) ×2 IMPLANT
SUT STRATAFIX PDS+ 0 24IN (SUTURE) ×2 IMPLANT
SUT VIC AB 1 CT1 36 (SUTURE) ×2 IMPLANT
SUT VIC AB 2-0 CT1 27 (SUTURE) ×3
SUT VIC AB 2-0 CT1 TAPERPNT 27 (SUTURE) ×3 IMPLANT
SYR 3ML LL SCALE MARK (SYRINGE) ×2 IMPLANT
TIBIAL BASE ATTUNE KNEE SZ9 (Knees) ×2 IMPLANT
TRAY FOLEY MTR SLVR 16FR STAT (SET/KITS/TRAYS/PACK) ×2 IMPLANT
TUBE SUCTION HIGH CAP CLEAR NV (SUCTIONS) ×2 IMPLANT
WATER STERILE IRR 1000ML POUR (IV SOLUTION) ×4 IMPLANT
WRAP KNEE MAXI GEL POST OP (GAUZE/BANDAGES/DRESSINGS) ×2 IMPLANT

## 2021-01-06 NOTE — Op Note (Signed)
NAME:  Alexander Hughes                      MEDICAL RECORD NO.:  [id number]                             FACILITY:  [facility]      PHYSICIAN:  Madlyn Frankel. Charlann Boxer, M.D.  DATE OF BIRTH:  April 22, 1951      DATE OF PROCEDURE:  01/06/2021                                     OPERATIVE REPORT         PREOPERATIVE DIAGNOSIS:  Right knee osteoarthritis.      POSTOPERATIVE DIAGNOSIS:  Right knee osteoarthritis.      FINDINGS:  The patient was noted to have complete loss of cartilage and   bone-on-bone arthritis with associated osteophytes in the medial and patellofemoral compartments of   the knee with a significant synovitis and associated effusion.  The patient had failed months of conservative treatment including medications, injection therapy, activity modification.     PROCEDURE:  Right total knee replacement.      COMPONENTS USED:  DePuy Attune rotating platform posterior stabilized knee   system, a size 9 femur, 9 tibia, size 5 mm PS AOX insert, and 41 anatomic patellar   button.      SURGEON:  Madlyn Frankel. Charlann Boxer, M.D.      ASSISTANT:  Rosalene Billings, PA-C.      ANESTHESIA:  Regional and Spinal.      SPECIMENS:  None.      COMPLICATION:  None.      DRAINS:  None.  EBL: <200 cc      TOURNIQUET TIME:   Total Tourniquet Time Documented: Thigh (Right) - 37 minutes Total: Thigh (Right) - 37 minutes  .      The patient was stable to the recovery room.      INDICATION FOR PROCEDURE:  Alexander Hughes is a 70 y.o. male patient of   mine.  The patient had been seen, evaluated, and treated for months conservatively in the   office with medication, activity modification, and injections.  The patient had   radiographic changes of bone-on-bone arthritis with endplate sclerosis and osteophytes noted.  Based on the radiographic changes and failed conservative measures, the patient   decided to proceed with definitive treatment, total knee replacement.  Risks of infection, DVT, component  failure, need for revision surgery, neurovascular injury were reviewed in the office setting.  The postop course was reviewed stressing the efforts to maximize post-operative satisfaction and function.  Consent was obtained for benefit of pain   relief.      PROCEDURE IN DETAIL:  The patient was brought to the operative theater.   Once adequate anesthesia, preoperative antibiotics, 2 gm of Ancef,1 gm of Tranexamic Acid, and 10 mg of Decadron administered, the patient was positioned supine with a right thigh tourniquet placed.  The  right lower extremity was prepped and draped in sterile fashion.  A time-   out was performed identifying the patient, planned procedure, and the appropriate extremity.      The right lower extremity was placed in the [facility] leg holder.  The leg was   exsanguinated, tourniquet elevated to 250 mmHg.  A midline incision was   made followed  by median parapatellar arthrotomy.  Following initial   exposure, attention was first directed to the patella.  Precut   measurement was noted to be 26 mm.  I resected down to 15 mm and used a   41 anatomic patellar button to restore patellar height as well as cover the cut surface.      The lug holes were drilled and a metal shim was placed to protect the   patella from retractors and saw blade during the procedure.      At this point, attention was now directed to the femur.  The femoral   canal was opened with a drill, irrigated to try to prevent fat emboli.  An   intramedullary rod was passed at 5 degrees valgus, 9 mm of bone was   resected off the distal femur.  Following this resection, the tibia was   subluxated anteriorly.  Using the extramedullary guide, 2 mm of bone was resected off   the proximal medial tibia.  We confirmed the gap would be   stable medially and laterally with a size 5 spacer block as well as confirmed that the tibial cut was perpendicular in the coronal plane, checking with an alignment rod.      Once  this was done, I sized the femur to be a size 9 in the anterior-   posterior dimension, chose a standard component based on medial and   lateral dimension.  The size 9 rotation block was then pinned in   position anterior referenced using the C-clamp to set rotation.  The   anterior, posterior, and  chamfer cuts were made without difficulty nor   notching making certain that I was along the anterior cortex to help   with flexion gap stability.      The final box cut was made off the lateral aspect of distal femur.      At this point, the tibia was sized to be a size 9.  The size 9 tray was   then pinned in position through the medial third of the tubercle,   drilled, and keel punched.  Trial reduction was now carried with a 9 femur,  9 tibia, a size 5 mm PS insert, and the 41 anatomic patella botton.  The knee was brought to full extension with good flexion stability with the patella   tracking through the trochlea without application of pressure.  Given   all these findings the trial components removed.  Final components were   opened and cement was mixed.  The knee was irrigated with normal saline solution and pulse lavage.  The synovial lining was   then injected with 30 cc of 0.25% Marcaine with epinephrine, 1 cc of Toradol and 30 cc of NS for a total of 61 cc.     Final implants were then cemented onto cleaned and dried cut surfaces of bone with the knee brought to extension with a size 5 mm PS trial insert.      Once the cement had fully cured, excess cement was removed   throughout the knee.  I confirmed that I was satisfied with the range of   motion and stability, and the final size 5 mm PS AOX insert was chosen.  It was   placed into the knee.      The tourniquet had been let down at 37 minutes.  No significant   hemostasis was required.  The extensor mechanism was then reapproximated using #1 Vicryl and #1  Stratafix sutures with the knee   in flexion.  The   remaining wound  was closed with 2-0 Vicryl and running 4-0 Monocryl.   The knee was cleaned, dried, dressed sterilely using Dermabond and   Aquacel dressing.  The patient was then   brought to recovery room in stable condition, tolerating the procedure   well.   Please note that Physician Assistant, Rosalene Billings, PA-C was present for the entirety of the case, and was utilized for pre-operative positioning, peri-operative retractor management, general facilitation of the procedure and for primary wound closure at the end of the case.              Madlyn Frankel Charlann Boxer, M.D.    01/06/2021 9:02 AM

## 2021-01-06 NOTE — Anesthesia Procedure Notes (Signed)
Spinal  Patient location during procedure: OR Start time: 01/06/2021 7:18 AM End time: 01/06/2021 7:28 AM Reason for block: surgical anesthesia Staffing Performed: resident/CRNA  Resident/CRNA: Lollie Sails, CRNA Preanesthetic Checklist Completed: patient identified, IV checked, site marked, risks and benefits discussed, surgical consent, monitors and equipment checked, pre-op evaluation and timeout performed Spinal Block Patient position: sitting Prep: DuraPrep and site prepped and draped Patient monitoring: heart rate, continuous pulse ox, blood pressure and cardiac monitor Approach: midline Location: L3-4 Injection technique: single-shot Needle Needle type: Pencan  Needle gauge: 24 G Needle length: 10 cm Assessment Events: CSF return Additional Notes Expiration date on kit noted and within range.  Sterile prep and drape applied.   Localized with 1% Lidocaine.  Good CSF flow noted with no heme or c/o paresthesia.   Patient tolerated well.

## 2021-01-06 NOTE — Anesthesia Procedure Notes (Signed)
Anesthesia Regional Block: Adductor canal block   Pre-Anesthetic Checklist: , timeout performed,  Correct Patient, Correct Site, Correct Laterality,  Correct Procedure, Correct Position, site marked,  Risks and benefits discussed,  Pre-op evaluation,  At surgeon's request and post-op pain management  Laterality: Right  Prep: Maximum Sterile Barrier Precautions used, chloraprep       Needles:  Injection technique: Single-shot  Needle Type: Echogenic Stimulator Needle     Needle Length: 9cm  Needle Gauge: 21     Additional Needles:   Procedures:,,,, ultrasound used (permanent image in chart),,    Narrative:  Start time: 01/06/2021 7:05 AM End time: 01/06/2021 7:10 AM Injection made incrementally with aspirations every 5 mL.  Performed by: Personally  Anesthesiologist: Gaynelle Adu, MD  Additional Notes: 2% Lidocaine skin wheel.

## 2021-01-06 NOTE — Care Plan (Signed)
Ortho Bundle Case Management Note  Patient Details  Name: Quasean Frye MRN: [id number] Date of Birth: 05/27/1951                  R TKA on 01/06/21. DCP: Home with wife. 1 story home with 0 steps. DME: No needs. Has RW. Doesn't want 3in1. PT: EO 8/5   DME Arranged:  N/A DME Agency:     HH Arranged:    HH Agency:     Additional Comments: Please contact me with any questions of if this plan should need to change.  Ennis Forts, RN,CCM [facility]  [phone] 01/06/2021, 10:15 AM

## 2021-01-06 NOTE — Anesthesia Procedure Notes (Signed)
Procedure Name: MAC Date/Time: 01/06/2021 7:18 AM Performed by: Lollie Sails, CRNA Pre-anesthesia Checklist: Patient identified, Emergency Drugs available, Suction available, Patient being monitored and Timeout performed Oxygen Delivery Method: Simple face mask Placement Confirmation: positive ETCO2

## 2021-01-06 NOTE — Discharge Instructions (Signed)

## 2021-01-06 NOTE — Plan of Care (Signed)

## 2021-01-06 NOTE — Transfer of Care (Signed)
Immediate Anesthesia Transfer of Care Note  Patient: Alexander Hughes  Procedure(s) Performed: TOTAL KNEE ARTHROPLASTY (Right: Knee)  Patient Location: PACU  Anesthesia Type:Spinal and MAC combined with regional for post-op pain  Level of Consciousness: awake, alert  and oriented  Airway & Oxygen Therapy: Patient Spontanous Breathing and Patient connected to face mask oxygen  Post-op Assessment: Report given to RN and Post -op Vital signs reviewed and stable  Post vital signs: Reviewed and stable  Last Vitals:  Vitals Value Taken Time  BP 109/72 01/06/21 0923  Temp    Pulse 77 01/06/21 0925  Resp 14 01/06/21 0925  SpO2 97 % 01/06/21 0925  Vitals shown include unvalidated device data.  Last Pain:  Vitals:   01/06/21 0552  TempSrc: Oral  PainSc:       Patients Stated Pain Goal: 4 (05/69/79 4801)  Complications: No notable events documented.

## 2021-01-06 NOTE — Evaluation (Signed)
Physical Therapy Evaluation Patient Details Name: Alexander Hughes MRN: [id number] DOB: [birth year] Today's Date: 01/06/2021   History of Present Illness  70 yo male s/p R TKA 01/06/21. Hx of NSTEMI  Clinical Impression  On eval POD 0, pt was Min assist for mobility. He walked ~90 feet with a RW. Minimal pain with activity. Pt did become dizzy, diaphoretic, and nauseous at start of session. He also vomited. Once feeling better, he was able to ambulate in the hallway without difficulty. Anticipate pt will progress well. Plan is for d/c home on tomorrow if meeting PT goals.     Follow Up Recommendations Follow surgeon's recommendation for DC plan and follow-up therapies    Equipment Recommendations  None recommended by PT    Recommendations for Other Services       Precautions / Restrictions Precautions Precautions: Fall;Knee Restrictions Weight Bearing Restrictions: No Other Position/Activity Restrictions: WBAT      Mobility  Bed Mobility Overal bed mobility: Needs Assistance Bed Mobility: Supine to Sit     Supine to sit: Min assist     General bed mobility comments: x2. Assist for LE. Increased time. 1st time, pt became dizzy and slightly less responsive so urgently assisted him back into supine. BP 175/66. Pt diaphoretic. He then became nauseous and vomited. Once feeling better and cleaned up, pt was able to sit EOB again-this time, no dizziness.    Transfers Overall transfer level: Needs assistance Equipment used: Rolling walker (2 wheeled) Transfers: Sit to/from Stand Sit to Stand: Min assist         General transfer comment: Assist to rise, steady, control descent. Cues for safety, hand placement.  Ambulation/Gait Ambulation/Gait assistance: Min assist Gait Distance (Feet): 90 Feet Assistive device: Rolling walker (2 wheeled) Gait Pattern/deviations: Step-through pattern;Decreased stride length     General Gait Details: Cues for safety, technique, sequence.  As distance progressed, pt began to use a reciprocal gait pattern. No c/o dizziness. Pt tolerated distance well.  Stairs            Wheelchair Mobility    Modified Rankin (Stroke Patients Only)       Balance Overall balance assessment: Mild deficits observed, not formally tested                                           Pertinent Vitals/Pain Pain Assessment: 0-10 Pain Score: 3  Pain Location: R knee Pain Descriptors / Indicators: Discomfort;Sore Pain Intervention(s): Limited activity within patient's tolerance;Monitored during session;Repositioned;Ice applied    Home Living Family/patient expects to be discharged to:: Private residence Living Arrangements: Spouse/significant other Available Help at Discharge: Family Type of Home: House Home Access: Stairs to enter Entrance Stairs-Rails: Right Entrance Stairs-Number of Steps: 2 Home Layout: One level;Laundry or work area in [employer] Equipment: Environmental consultant - 2 wheels      Prior Function Level of Independence: Independent               Hand Dominance        Extremity/Trunk Assessment   Upper Extremity Assessment Upper Extremity Assessment: Overall WFL for tasks assessed    Lower Extremity Assessment Lower Extremity Assessment: Generalized weakness    Cervical / Trunk Assessment Cervical / Trunk Assessment: Normal  Communication   Communication: No difficulties;Prefers language other than English (prefers [place] language but understands/speaks English)  Cognition Arousal/Alertness: Awake/alert Behavior During Therapy: WFL for tasks  assessed/performed Overall Cognitive Status: Within Functional Limits for tasks assessed                                        General Comments      Exercises     Assessment/Plan    PT Assessment Patient needs continued PT services  PT Problem List Decreased strength;Decreased range of motion;Decreased mobility;Decreased  activity tolerance;Decreased balance;Decreased knowledge of use of DME;Pain       PT Treatment Interventions DME instruction;Gait training;Therapeutic exercise;Balance training;Stair training;Functional mobility training;Therapeutic activities;Patient/family education    PT Goals (Current goals can be found in the Care Plan section)  Acute Rehab PT Goals Patient Stated Goal: regain PLOF. home. PT Goal Formulation: With patient Time For Goal Achievement: 01/20/21 Potential to Achieve Goals: Good    Frequency 7X/week   Barriers to discharge        Co-evaluation               AM-PAC PT "6 Clicks" Mobility  Outcome Measure Help needed turning from your back to your side while in a flat bed without using bedrails?: A Little Help needed moving from lying on your back to sitting on the side of a flat bed without using bedrails?: A Little Help needed moving to and from a bed to a chair (including a wheelchair)?: A Little Help needed standing up from a chair using your arms (e.g., wheelchair or bedside chair)?: A Little Help needed to walk in hospital room?: A Little Help needed climbing 3-5 steps with a railing? : A Little 6 Click Score: 18    End of Session Equipment Utilized During Treatment: Gait belt Activity Tolerance: Patient tolerated treatment well Patient left: in chair;with call bell/phone within reach;with chair alarm set   PT Visit Diagnosis: Other abnormalities of gait and mobility (R26.89);Pain Pain - Right/Left: Right Pain - part of body: Knee    Time: 8115-7262 PT Time Calculation (min) (ACUTE ONLY): 22 min   Charges:   PT Evaluation $PT Eval Low Complexity: 1 Low             Faye Ramsay, PT Acute Rehabilitation  Office: [phone] Pager: [phone]

## 2021-01-06 NOTE — Progress Notes (Addendum)
Lipitor, Metoprolol and Pantoprazole not given to patient at scheduled time. While I was doing hie medication, patient states that he took these medication around 2pm today while he was in short stay and he did not tell anybody. he states his daughter got the medicine from home.Patient education given not to take any home medication while in the hospital.    Ignacia Bayley, RN

## 2021-01-06 NOTE — Anesthesia Postprocedure Evaluation (Signed)
Anesthesia Post Note  Patient: Alexander Hughes  Procedure(s) Performed: TOTAL KNEE ARTHROPLASTY (Right: Knee)     Patient location during evaluation: PACU Anesthesia Type: Spinal and Regional Level of consciousness: oriented and awake and alert Pain management: pain level controlled Vital Signs Assessment: post-procedure vital signs reviewed and stable Respiratory status: spontaneous breathing, respiratory function stable and patient connected to nasal cannula oxygen Cardiovascular status: blood pressure returned to baseline and stable Postop Assessment: no headache, no backache, no apparent nausea or vomiting, spinal receding and patient able to bend at knees Anesthetic complications: no   No notable events documented.  Last Vitals:  Vitals:   01/06/21 1045 01/06/21 1100  BP: 128/77 136/81  Pulse: (!) 59 (!) 56  Resp: 15 14  Temp:  36.6 C  SpO2: 99% 97%    Last Pain:  Vitals:   01/06/21 1105  TempSrc:   PainSc: 5                  ,W. EDMOND

## 2021-01-06 NOTE — Interval H&P Note (Signed)
History and Physical Interval Note:  01/06/2021 7:08 AM  Alexander Hughes  has presented today for surgery, with the diagnosis of Right knee osteoarthritis.  The various methods of treatment have been discussed with the patient and family. After consideration of risks, benefits and other options for treatment, the patient has consented to  Procedure(s): TOTAL KNEE ARTHROPLASTY (Right) as a surgical intervention.  The patient's history has been reviewed, patient examined, no change in status, stable for surgery.  I have reviewed the patient's chart and labs.  Questions were answered to the patient's satisfaction.     Shelda Pal

## 2021-01-07 ENCOUNTER — Encounter (HOSPITAL_COMMUNITY): Payer: Self-pay | Admitting: Orthopedic Surgery

## 2021-01-07 DIAGNOSIS — I10 Essential (primary) hypertension: Secondary | ICD-10-CM | POA: Diagnosis not present

## 2021-01-07 DIAGNOSIS — M1711 Unilateral primary osteoarthritis, right knee: Secondary | ICD-10-CM | POA: Diagnosis not present

## 2021-01-07 DIAGNOSIS — I2510 Atherosclerotic heart disease of native coronary artery without angina pectoris: Secondary | ICD-10-CM | POA: Diagnosis not present

## 2021-01-07 DIAGNOSIS — Z7982 Long term (current) use of aspirin: Secondary | ICD-10-CM | POA: Diagnosis not present

## 2021-01-07 DIAGNOSIS — Z79899 Other long term (current) drug therapy: Secondary | ICD-10-CM | POA: Diagnosis not present

## 2021-01-07 LAB — BASIC METABOLIC PANEL
Anion gap: 7 (ref 5–15)
BUN: 23 mg/dL (ref 8–23)
CO2: 25 mmol/L (ref 22–32)
Calcium: 8.7 mg/dL — ABNORMAL LOW (ref 8.9–10.3)
Chloride: 106 mmol/L (ref 98–111)
Creatinine, Ser: 0.87 mg/dL (ref 0.61–1.24)
GFR, Estimated: >60 mL/min (ref >60)
Glucose, Bld: 130 mg/dL — ABNORMAL HIGH (ref 70–99)
Potassium: 4.3 mmol/L (ref 3.5–5.1)
Sodium: 138 mmol/L (ref 135–145)

## 2021-01-07 LAB — CBC
HCT: 32.1 % — ABNORMAL LOW (ref 39.0–52.0)
Hemoglobin: 10.8 g/dL — ABNORMAL LOW (ref 13.0–17.0)
MCH: 30.5 pg (ref 26.0–34.0)
MCHC: 33.6 g/dL (ref 30.0–36.0)
MCV: 90.7 fL (ref 80.0–100.0)
Platelets: 164 10*3/uL (ref 150–400)
RBC: 3.54 MIL/uL — ABNORMAL LOW (ref 4.22–5.81)
RDW: 12.8 % (ref 11.5–15.5)
WBC: 10.3 10*3/uL (ref 4.0–10.5)
nRBC: 0 % (ref 0.0–0.2)

## 2021-01-07 MED ORDER — HYDROCODONE-ACETAMINOPHEN 5-325 MG PO TABS: 1.0000 | ORAL_TABLET | ORAL | 0 refills | Status: DC | PRN

## 2021-01-07 MED ORDER — POLYETHYLENE GLYCOL 3350 17 G PO PACK: 17.0000 g | PACK | Freq: Every day | ORAL | 0 refills | Status: AC | PRN

## 2021-01-07 MED ORDER — METHOCARBAMOL 500 MG PO TABS: 500.0000 mg | ORAL_TABLET | Freq: Four times a day (QID) | ORAL | 0 refills | Status: DC | PRN

## 2021-01-07 MED ORDER — ASPIRIN 81 MG PO CHEW: 81.0000 mg | CHEWABLE_TABLET | Freq: Two times a day (BID) | ORAL | 0 refills | Status: AC

## 2021-01-07 MED ORDER — DOCUSATE SODIUM 100 MG PO CAPS: 100.0000 mg | ORAL_CAPSULE | Freq: Two times a day (BID) | ORAL | 0 refills | Status: DC

## 2021-01-07 MED ORDER — CELECOXIB 200 MG PO CAPS: 200.0000 mg | ORAL_CAPSULE | Freq: Two times a day (BID) | ORAL | 0 refills | Status: DC

## 2021-01-07 NOTE — Progress Notes (Signed)
Subjective: 1 Day Post-Op Procedure(s) (LRB): TOTAL KNEE ARTHROPLASTY (Right) Patient reports pain as mild.   Patient seen in rounds by Dr. Charlann Boxer. Patient is well, and has had no acute complaints or problems. No acute events overnight. Foley catheter removed. Patient ambulated 90 feet with PT. He had an episode of vomiting, which resolved and he was able to continue to work with therapy. We will continue therapy today.   Objective: Vital signs in last 24 hours: Temp:  [97.6 F (36.4 C)-98.5 F (36.9 C)] 97.8 F (36.6 C) (08/03 0535) Pulse Rate:  [56-79] 64 (08/03 0535) Resp:  [10-18] 16 (08/03 0535) BP: (95-156)/(52-92) 136/81 (08/03 0535) SpO2:  [94 %-100 %] 99 % (08/03 0535)  Intake/Output from previous day:  Intake/Output Summary (Last 24 hours) at 01/07/2021 0727 Last data filed at 01/07/2021 0610 Gross per 24 hour  Intake 2640 ml  Output 3060 ml  Net -420 ml     Intake/Output this shift: No intake/output data recorded.  Labs: Recent Labs    01/06/21 1023 01/07/21 0317  HGB 11.7* 10.8*   Recent Labs    01/06/21 1023 01/07/21 0317  WBC 6.0 10.3  RBC 3.85* 3.54*  HCT 35.4* 32.1*  PLT 165 164   Recent Labs    01/06/21 1023 01/07/21 0317  NA 137 138  K 4.5 4.3  CL 104 106  CO2 24 25  BUN 23 23  CREATININE 1.11 0.87  GLUCOSE 115* 130*  CALCIUM 8.8* 8.7*   No results for input(s): LABPT, INR in the last 72 hours.  Exam: General - Patient is Alert and Oriented Extremity - Neurologically intact Sensation intact distally Intact pulses distally Dorsiflexion/Plantar flexion intact Dressing - dressing C/D/I Motor Function - intact, moving foot and toes well on exam.   Past Medical History:  Diagnosis Date   Coronary artery disease    Elevated liver enzymes 09/06/2018   Hyperlipidemia    Hypertension    NSTEMI (non-ST elevated myocardial infarction) (HCC) 08/25/2018   08/24/2018 On Brilinta, aspirin, Lipitor, Toprol-XL Nitroglycerin as needed Status  post PCI, found total occlusion of OM1.  PTCA and stent placement in mid OM1.  Mild to moderate disease recommending medical management.  Patient will be on aspirin/Brilinta for 1 year.  Lipitor 20 mg for now in view of mild LFT elevation.    Unstable angina (HCC) 08/25/2018    Assessment/Plan: 1 Day Post-Op Procedure(s) (LRB): TOTAL KNEE ARTHROPLASTY (Right) Active Problems:   S/P total knee arthroplasty, right  Estimated body mass index is 30.66 kg/m as calculated from the following:   Height as of this encounter: 6\' 1"  (1.854 m).   Weight as of this encounter: 105.4 kg. Advance diet Up with therapy D/C IV fluids   Patient's anticipated LOS is less than 2 midnights, meeting these requirements: - Younger than 44 - Lives within 1 hour of care - Has a competent adult at home to recover with post-op recover - NO history of  - Chronic pain requiring opiods  - Diabetes  - Coronary Artery Disease  - Heart failure  - Heart attack  - Stroke  - DVT/VTE  - Cardiac arrhythmia  - Respiratory Failure/COPD  - Renal failure  - Anemia  - Advanced Liver disease     DVT Prophylaxis - Aspirin Weight bearing as tolerated.  Hgb stable at 10.8 today.   Plan is to go Home after hospital stay. Plan for discharge today following 1-2 sessions of PT as long as they are meeting  their goals. Patient is scheduled for OPPT. Follow up in the office in 2 weeks.   Dennie Bible, PA-C Orthopedic Surgery [phone] 01/07/2021, 7:27 AM

## 2021-01-07 NOTE — TOC Transition Note (Signed)
Transition of Care [facility]) - CM/SW Discharge Note  Patient Details  Name: Alexander Hughes MRN: [id number] Date of Birth: 08-19-1950  [facility]) CM/SW Contact:  Sherie Don, LCSW Phone Number: 01/07/2021, 10:57 AM  Clinical Narrative: Patient is expected to discharge home after working with PT. CSW met with patient to confirm discharge plan. Patient will discharge home with OPPT through [facility] with his appointment scheduled for 01/09/21. Patient has a rolling walker at home, so there are no DME needs at this time. TOC signing off.  Final next level of care: OP Rehab Barriers to Discharge: No Barriers Identified  Patient Goals and CMS Choice Patient states their goals for this hospitalization and ongoing recovery are:: Discharge home with with [place] CMS Medicare.gov Compare Post Acute Care list provided to:: Patient Choice offered to / list presented to : NA  Discharge Plan and Services        DME Arranged: N/A DME Agency: NA  Readmission Risk Interventions No flowsheet data found.

## 2021-01-07 NOTE — Progress Notes (Signed)
Physical Therapy Treatment Patient Details Name: Alexander Hughes MRN: [id number] DOB: 29-Apr-1951 Today's Date: 01/07/2021    History of Present Illness 70 yo male s/p R TKA 01/06/21. Hx of NSTEMI    PT Comments    Progressing well with mobility. Pt is very independent in nature and is very motivated to do things on his own. There is a bit of a language barrier, but for the most part, he understands. Reviewed/practiced exercises, gait training and stair training. All education completed. Okay to d/c from PT standpoint.    Follow Up Recommendations  Follow surgeon's recommendation for DC plan and follow-up therapies     Equipment Recommendations  None recommended by PT    Recommendations for Other Services       Precautions / Restrictions Precautions Precautions: Fall;Knee Restrictions Weight Bearing Restrictions: No Other Position/Activity Restrictions: WBAT    Mobility  Bed Mobility Overal bed mobility: Modified Independent                  Transfers Overall transfer level: Needs assistance Equipment used: Rolling walker (2 wheeled) Transfers: Sit to/from Stand Sit to Stand: Supervision         General transfer comment: Supv for safety. No LOB. (Pt is very independent)  Ambulation/Gait Ambulation/Gait assistance: Supervision Gait Distance (Feet): 200 Feet Assistive device: Rolling walker (2 wheeled) Gait Pattern/deviations: Step-through pattern;Decreased stride length     General Gait Details: Supv for safety. Pt denied dizziness. Minimal pain reported. No LOB with RW use.   Stairs Stairs: Yes Stairs assistance: Supervision Stair Management: Forwards;Two rails Number of Stairs: 2 General stair comments: up and over portable stairs x 1. Cues for safety, sequence.   Wheelchair Mobility    Modified Rankin (Stroke Patients Only)       Balance Overall balance assessment: Mild deficits observed, not formally tested                                           Cognition Arousal/Alertness: Awake/alert Behavior During Therapy: WFL for tasks assessed/performed Overall Cognitive Status: Within Functional Limits for tasks assessed                                        Exercises Total Joint Exercises Ankle Circles/Pumps: AROM;Both;10 reps Quad Sets: AROM;Both;10 reps Hip ABduction/ADduction: AROM;Right;10 reps Straight Leg Raises: AROM;Right;10 reps Goniometric ROM: ~10-90 degrees    General Comments        Pertinent Vitals/Pain Pain Assessment: 0-10 Pain Score: 2  Pain Location: R knee Pain Descriptors / Indicators: Discomfort;Sore Pain Intervention(s): Monitored during session    Home Living                      Prior Function            PT Goals (current goals can now be found in the care plan section) Progress towards PT goals: Progressing toward goals    Frequency    7X/week      PT Plan Current plan remains appropriate    Co-evaluation              AM-PAC PT "6 Clicks" Mobility   Outcome Measure  Help needed turning from your back to your side while in a flat bed without using bedrails?: None Help  needed moving from lying on your back to sitting on the side of a flat bed without using bedrails?: None Help needed moving to and from a bed to a chair (including a wheelchair)?: None Help needed standing up from a chair using your arms (e.g., wheelchair or bedside chair)?: None Help needed to walk in hospital room?: A Little Help needed climbing 3-5 steps with a railing? : A Little 6 Click Score: 22    End of Session Equipment Utilized During Treatment: Gait belt Activity Tolerance: Patient tolerated treatment well Patient left: in chair;with call bell/phone within reach;with chair alarm set   PT Visit Diagnosis: Other abnormalities of gait and mobility (R26.89) Pain - Right/Left: Right Pain - part of body: Knee     Time: 0569-7948 PT Time  Calculation (min) (ACUTE ONLY): 14 min  Charges:  $Gait Training: 8-22 mins                         Faye Ramsay, PT Acute Rehabilitation  Office: [phone] Pager: [phone]

## 2021-01-09 DIAGNOSIS — M25561 Pain in right knee: Secondary | ICD-10-CM | POA: Diagnosis not present

## 2021-01-15 NOTE — Discharge Summary (Signed)
Physician Discharge Summary   Patient ID: Alexander Hughes MRN: [id number] DOB/AGE: Nov 05, 1950 70 y.o.  Admit date: 01/06/2021 Discharge date: 01/07/2021  Primary Diagnosis: Right knee osteoarthritis.   Admission Diagnoses:  Past Medical History:  Diagnosis Date   Coronary artery disease    Elevated liver enzymes 09/06/2018   Hyperlipidemia    Hypertension    NSTEMI (non-ST elevated myocardial infarction) (HCC) 08/25/2018   08/24/2018 On Brilinta, aspirin, Lipitor, Toprol-XL Nitroglycerin as needed Status post PCI, found total occlusion of OM1.  PTCA and stent placement in mid OM1.  Mild to moderate disease recommending medical management.  Patient will be on aspirin/Brilinta for 1 year.  Lipitor 20 mg for now in view of mild LFT elevation.    Unstable angina (HCC) 08/25/2018   Discharge Diagnoses:   Active Problems:   S/P total knee arthroplasty, right  Estimated body mass index is 30.66 kg/m as calculated from the following:   Height as of this encounter: 6\' 1"  (1.854 m).   Weight as of this encounter: 105.4 kg.  Procedure:  Procedure(s) (LRB): TOTAL KNEE ARTHROPLASTY (Right)   Consults: None  HPI:  Alexander Hughes is a 71 y.o. male patient of   mine.  The patient had been seen, evaluated, and treated for months conservatively in the   office with medication, activity modification, and injections.  The patient had radiographic changes of bone-on-bone arthritis with endplate sclerosis and osteophytes noted.  Based on the radiographic changes and failed conservative measures, the patient   decided to proceed with definitive treatment, total knee replacement.  Risks of infection, DVT, component failure, need for revision surgery, neurovascular injury were reviewed in the office setting.  The postop course was reviewed stressing the efforts to maximize post-operative satisfaction and function.  Consent was obtained for benefit of pain   relief.   Laboratory Data: Admission on  01/06/2021, Discharged on 01/07/2021  Component Date Value Ref Range Status   WBC 01/06/2021 6.0  4.0 - 10.5 K/uL Final   RBC 01/06/2021 3.85 (A) 4.22 - 5.81 MIL/uL Final   Hemoglobin 01/06/2021 11.7 (A) 13.0 - 17.0 g/dL Final   HCT 03/08/2021 35.4 (A) 39.0 - 52.0 % Final   MCV 01/06/2021 91.9  80.0 - 100.0 fL Final   MCH 01/06/2021 30.4  26.0 - 34.0 pg Final   MCHC 01/06/2021 33.1  30.0 - 36.0 g/dL Final   RDW 03/08/2021 12.8  11.5 - 15.5 % Final   Platelets 01/06/2021 165  150 - 400 K/uL Final   nRBC 01/06/2021 0.0  0.0 - 0.2 % Final   Performed at [facility], [address], [place], [place]   Sodium 01/06/2021 137  135 - 145 mmol/L Final   Potassium 01/06/2021 4.5  3.5 - 5.1 mmol/L Final   Chloride 01/06/2021 104  98 - 111 mmol/L Final   CO2 01/06/2021 24  22 - 32 mmol/L Final   Glucose, Bld 01/06/2021 115 (A) 70 - 99 mg/dL Final   Glucose reference range applies only to samples taken after fasting for at least 8 hours.   BUN 01/06/2021 23  8 - 23 mg/dL Final   Creatinine, Ser 01/06/2021 1.11  0.61 - 1.24 mg/dL Final   Calcium 03/08/2021 8.8 (A) 8.9 - 10.3 mg/dL Final   Total Protein 57/84/6962 6.5  6.5 - 8.1 g/dL Final   Albumin 95/28/4132 3.8  3.5 - 5.0 g/dL Final   AST 44/06/270 26  15 - 41 U/L Final   ALT  01/06/2021 34  0 - 44 U/L Final   Alkaline Phosphatase 01/06/2021 71  38 - 126 U/L Final   Total Bilirubin 01/06/2021 0.9  0.3 - 1.2 mg/dL Final   GFR, Estimated 01/06/2021 >60  >60 mL/min Final   Comment: (NOTE) Calculated using the CKD-EPI Creatinine Equation (2021)    Anion gap 01/06/2021 9  5 - 15 Final   Performed at [facility], [address], [place], [place] [address]   ABO/RH(D) 01/06/2021 A POS   Final   Antibody Screen 01/06/2021 NEG   Final   Sample Expiration 01/06/2021    Final                   Value:01/09/2021,2359 Performed at [facility], [address], [place], [place]  [address]    ABO/RH(D) 01/06/2021    Final                   Value:A POS Performed at [facility], [address], [place], [place] [address]    WBC 01/07/2021 10.3  4.0 - 10.5 K/uL Final   RBC 01/07/2021 3.54 (A) 4.22 - 5.81 MIL/uL Final   Hemoglobin 01/07/2021 10.8 (A) 13.0 - 17.0 g/dL Final   HCT 91/47/8295 32.1 (A) 39.0 - 52.0 % Final   MCV 01/07/2021 90.7  80.0 - 100.0 fL Final   MCH 01/07/2021 30.5  26.0 - 34.0 pg Final   MCHC 01/07/2021 33.6  30.0 - 36.0 g/dL Final   RDW 62/13/0865 12.8  11.5 - 15.5 % Final   Platelets 01/07/2021 164  150 - 400 K/uL Final   nRBC 01/07/2021 0.0  0.0 - 0.2 % Final   Performed at [facility], [address] [place], [place] [address]   Sodium 01/07/2021 138  135 - 145 mmol/L Final   Potassium 01/07/2021 4.3  3.5 - 5.1 mmol/L Final   Chloride 01/07/2021 106  98 - 111 mmol/L Final   CO2 01/07/2021 25  22 - 32 mmol/L Final   Glucose, Bld 01/07/2021 130 (A) 70 - 99 mg/dL Final   Glucose reference range applies only to samples taken after fasting for at least 8 hours.   BUN 01/07/2021 23  8 - 23 mg/dL Final   Creatinine, Ser 01/07/2021 0.87  0.61 - 1.24 mg/dL Final   Calcium 62/95/2841 8.7 (A) 8.9 - 10.3 mg/dL Final   GFR, Estimated 01/07/2021 >60  >60 mL/min Final   Comment: (NOTE) Calculated using the CKD-EPI Creatinine Equation (2021)    Anion gap 01/07/2021 7  5 - 15 Final   Performed at [facility], [address] [place], [place] [address]  Hospital Outpatient Visit on 01/02/2021  Component Date Value Ref Range Status   SARS Coronavirus 2 01/02/2021 NEGATIVE  NEGATIVE Final   Comment: (NOTE) SARS-CoV-2 target nucleic acids are NOT DETECTED.  The SARS-CoV-2 RNA is generally detectable in upper and lower respiratory specimens during the acute phase of infection. Negative results do not preclude SARS-CoV-2 infection, do not rule out co-infections with other pathogens, and should not  be used as the sole basis for treatment or other patient management decisions. Negative results must be combined with clinical observations, patient history, and epidemiological information. The expected result is Negative.  Fact Sheet for Patients: HairSlick.no  Fact Sheet for Healthcare Providers: quierodirigir.com  This test is not yet approved or cleared by the [place] FDA and  has been authorized for detection  and/or diagnosis of SARS-CoV-2 by FDA under an Emergency Use Authorization (EUA). This EUA will remain  in effect (meaning this test can be used) for the duration of the COVID-19 declaration under Se                          ction 564(b)(1) of the Act, 21 U.S.C. section 360bbb-3(b)(1), unless the authorization is terminated or revoked sooner.  Performed at [facility], [address] [place], [place] [address]   Hospital Outpatient Visit on 12/25/2020  Component Date Value Ref Range Status   MRSA, PCR 12/25/2020 NEGATIVE  NEGATIVE Final   Staphylococcus aureus 12/25/2020 NEGATIVE  NEGATIVE Final   Comment: (NOTE) The Xpert SA Assay (FDA approved for NASAL specimens in patients 47 years of age and older), is one component of a comprehensive surveillance program. It is not intended to diagnose infection nor to guide or monitor treatment. Performed at [facility], [address] [place], [place] [address]    Sodium 12/25/2020 140  135 - 145 mmol/L Final   Potassium 12/25/2020 4.6  3.5 - 5.1 mmol/L Final   Chloride 12/25/2020 108  98 - 111 mmol/L Final   CO2 12/25/2020 26  22 - 32 mmol/L Final   Glucose, Bld 12/25/2020 98  70 - 99 mg/dL Final   Glucose reference range applies only to samples taken after fasting for at least 8 hours.   BUN 12/25/2020 26 (A) 8 - 23 mg/dL Final   Creatinine, Ser 12/25/2020 1.20  0.61 - 1.24 mg/dL Final   Calcium 82/95/6213 9.0  8.9 - 10.3 mg/dL  Final   GFR, Estimated 12/25/2020 >60  >60 mL/min Final   Comment: (NOTE) Calculated using the CKD-EPI Creatinine Equation (2021)    Anion gap 12/25/2020 6  5 - 15 Final   Performed at [facility], [address] [place], [place] [address]   WBC 12/25/2020 5.4  4.0 - 10.5 K/uL Final   RBC 12/25/2020 4.15 (A) 4.22 - 5.81 MIL/uL Final   Hemoglobin 12/25/2020 12.6 (A) 13.0 - 17.0 g/dL Final   HCT 84/69/6295 37.4 (A) 39.0 - 52.0 % Final   MCV 12/25/2020 90.1  80.0 - 100.0 fL Final   MCH 12/25/2020 30.4  26.0 - 34.0 pg Final   MCHC 12/25/2020 33.7  30.0 - 36.0 g/dL Final   RDW 28/41/3244 12.5  11.5 - 15.5 % Final   Platelets 12/25/2020 197  150 - 400 K/uL Final   nRBC 12/25/2020 0.0  0.0 - 0.2 % Final   Performed at [facility], [address] [place], [place] [address]     X-Rays:PCV ECHOCARDIOGRAM COMPLETE  Addendum Date: 01/04/2021   Echocardiogram 01/01/2021: Left ventricle cavity is normal in size. Mild concentric hypertrophy of the left ventricle. Normal global wall motion. Normal LV systolic function with EF 55%. Doppler evidence of grade I (impaired) diastolic dysfunction, normal LAP. The aortic root is dilated. Max dimension at prox ascending aorta measures 4.4 cm. Left atrial cavity is mildly dilated. Aneurysmal interatrial septum without evidence of interatrial shunting. Trileaflet aortic valve.  Mild (Grade I) aortic regurgitation. Moderate (Grade II) mitral regurgitation. Mild tricuspid regurgitation. No evidence of pulmonary hypertension. No significant change compared to previous study on 08/25/2018.  Result Date: 01/04/2021 Echocardiogram 01/01/2021: Left ventricle cavity is normal in size. Mild concentric hypertrophy of the left ventricle. Normal global wall motion. Normal LV systolic function with EF 55%. Doppler  evidence of grade I (impaired) diastolic dysfunction, normal LAP. The aortic root is dilated. Max dimension at prox ascending  aorta measures 4.4 cm. Left atrial cavity is mildly dilated. Trileaflet aortic valve.  Mild (Grade I) aortic regurgitation. Moderate (Grade II) mitral regurgitation. Mild tricuspid regurgitation. No evidence of pulmonary hypertension. No significant change compared to previous study on 08/25/2018.   EKG: Orders placed or performed in visit on 12/31/20   EKG 12-Lead     Hospital Course: Alexander Hughes is a 70 y.o. who was admitted to [facility]. They were brought to the operating room on 01/06/2021 and underwent Procedure(s): TOTAL KNEE ARTHROPLASTY.  Patient tolerated the procedure well and was later transferred to the recovery room and then to the orthopaedic floor for postoperative care. They were given PO and IV analgesics for pain control following their surgery. They were given 24 hours of postoperative antibiotics of  Anti-infectives (From admission, onward)    Start     Dose/Rate Route Frequency Ordered Stop   01/06/21 1400  ceFAZolin (ANCEF) IVPB 2g/100 mL premix        2 g 200 mL/hr over 30 Minutes Intravenous Every 6 hours 01/06/21 1354 01/06/21 2033   01/06/21 1357  ceFAZolin (ANCEF) 2-4 GM/100ML-% IVPB       Note to Pharmacy: Jodi Marble   : cabinet override      01/06/21 1357 01/07/21 0159   01/06/21 0600  ceFAZolin (ANCEF) IVPB 2g/100 mL premix        2 g 200 mL/hr over 30 Minutes Intravenous On call to O.R. 01/06/21 8841 01/06/21 0745      and started on DVT prophylaxis in the form of Aspirin.   PT and OT were ordered for total joint protocol. Discharge planning consulted to help with postop disposition and equipment needs.  Patient had a good night on the evening of surgery. They started to get up OOB with therapy on POD #1. Pt was seen during rounds and was ready to go home pending progress with therapy.He worked with therapy on POD #1 and was meeting his goals. Pt was discharged to home later that day in stable condition.  Diet: Regular diet Activity:  WBAT Follow-up: in 2 weeks Disposition: Home Discharged Condition: good   Discharge Instructions     Call MD / Call 911   Complete by: As directed    If you experience chest pain or shortness of breath, CALL 911 and be transported to the hospital emergency room.  If you develope a fever above 101 F, pus (white drainage) or increased drainage or redness at the wound, or calf pain, call your surgeon's office.   Change dressing   Complete by: As directed    Maintain surgical dressing until follow up in the clinic. If the edges start to pull up, may reinforce with tape. If the dressing is no longer working, may remove and cover with gauze and tape, but must keep the area dry and clean.  Call with any questions or concerns.   Constipation Prevention   Complete by: As directed    Drink plenty of fluids.  Prune juice may be helpful.  You may use a stool softener, such as Colace (over the counter) 100 mg twice a day.  Use MiraLax (over the counter) for constipation as needed.   Diet - low sodium heart healthy   Complete by: As directed    Increase activity slowly as tolerated   Complete by: As directed  Weight bearing as tolerated with assist device (walker, cane, etc) as directed, use it as long as suggested by your surgeon or therapist, typically at least 4-6 weeks.   Post-operative opioid taper instructions:   Complete by: As directed    POST-OPERATIVE OPIOID TAPER INSTRUCTIONS: It is important to wean off of your opioid medication as soon as possible. If you do not need pain medication after your surgery it is ok to stop day one. Opioids include: Codeine, Hydrocodone(Norco, Vicodin), Oxycodone(Percocet, oxycontin) and hydromorphone amongst others.  Long term and even short term use of opiods can cause: Increased pain response Dependence Constipation Depression Respiratory depression And more.  Withdrawal symptoms can include Flu like symptoms Nausea, vomiting And more Techniques  to manage these symptoms Hydrate well Eat regular healthy meals Stay active Use relaxation techniques(deep breathing, meditating, yoga) Do Not substitute Alcohol to help with tapering If you have been on opioids for less than two weeks and do not have pain than it is ok to stop all together.  Plan to wean off of opioids This plan should start within one week post op of your joint replacement. Maintain the same interval or time between taking each dose and first decrease the dose.  Cut the total daily intake of opioids by one tablet each day Next start to increase the time between doses. The last dose that should be eliminated is the evening dose.      TED hose   Complete by: As directed    Use stockings (TED hose) for 2 weeks on both leg(s).  You may remove them at night for sleeping.      Allergies as of 01/07/2021   No Known Allergies      Medication List     STOP taking these medications    aspirin 81 MG EC tablet Replaced by: aspirin 81 MG chewable tablet       TAKE these medications    aspirin 81 MG chewable tablet Chew 1 tablet (81 mg total) by mouth 2 (two) times daily for 28 days. Replaces: aspirin 81 MG EC tablet   atorvastatin 40 MG tablet Commonly known as: Lipitor Take 1 tablet (40 mg total) by mouth daily.   celecoxib 200 MG capsule Commonly known as: CELEBREX Take 1 capsule (200 mg total) by mouth 2 (two) times daily.   Dialyvite Vitamin D 5000 125 MCG (5000 UT) capsule Generic drug: Cholecalciferol Take 5,000 Units by mouth daily.   docusate sodium 100 MG capsule Commonly known as: COLACE Take 1 capsule (100 mg total) by mouth 2 (two) times daily.   HYDROcodone-acetaminophen 5-325 MG tablet Commonly known as: NORCO/VICODIN Take 1-2 tablets by mouth every 4 (four) hours as needed for severe pain.   losartan 25 MG tablet Commonly known as: COZAAR Take 1 tablet (25 mg total) by mouth daily.   methocarbamol 500 MG tablet Commonly known as:  ROBAXIN Take 1 tablet (500 mg total) by mouth every 6 (six) hours as needed for muscle spasms.   metoprolol succinate 50 MG 24 hr tablet Commonly known as: Toprol XL Take 1 tablet (50 mg total) by mouth daily. Take with or immediately following a meal.   nitroGLYCERIN 0.4 MG SL tablet Commonly known as: Nitrostat Place 1 tablet (0.4 mg total) under the tongue every 5 (five) minutes as needed for chest pain.   pantoprazole 40 MG tablet Commonly known as: PROTONIX Take 1 tablet (40 mg total) by mouth daily.   polyethylene glycol 17 g packet Commonly  known as: MIRALAX / GLYCOLAX Take 17 g by mouth daily as needed for mild constipation.               Discharge Care Instructions  (From admission, onward)           Start     Ordered   01/07/21 0000  Change dressing       Comments: Maintain surgical dressing until follow up in the clinic. If the edges start to pull up, may reinforce with tape. If the dressing is no longer working, may remove and cover with gauze and tape, but must keep the area dry and clean.  Call with any questions or concerns.   01/07/21 1610            Follow-up Information     Durene Romans, MD. Go on 01/22/2021.   Specialty: Orthopedic Surgery Why: You are scheduled for first post op appointment on Thursday August 18th at 10:00am. Contact information: [address] [place] [address] [phone]         Zorita Pang.. Go on 01/09/2021.   Why: You are scheduled for physical therapy eval on Friday August 5th at 1:30pm. Contact information: [address] [place] [address] [phone]                 Signed: Dennie Bible, PA-C Orthopedic Surgery 01/15/2021, 8:55 AM

## 2021-01-16 DIAGNOSIS — M25561 Pain in right knee: Secondary | ICD-10-CM | POA: Diagnosis not present

## 2021-01-22 ENCOUNTER — Ambulatory Visit
Admission: RE | Admit: 2021-01-22 | Discharge: 2021-01-22 | Disposition: A | Discharge status: Home / Self Care | Payer: Medicare HMO | Source: Ambulatory Visit | Attending: Cardiology | Admitting: Cardiology

## 2021-01-22 DIAGNOSIS — I712 Thoracic aortic aneurysm, without rupture, unspecified: Secondary | ICD-10-CM

## 2021-01-22 DIAGNOSIS — I2510 Atherosclerotic heart disease of native coronary artery without angina pectoris: Secondary | ICD-10-CM | POA: Diagnosis not present

## 2021-01-22 DIAGNOSIS — M47812 Spondylosis without myelopathy or radiculopathy, cervical region: Secondary | ICD-10-CM | POA: Diagnosis not present

## 2021-01-22 DIAGNOSIS — M47814 Spondylosis without myelopathy or radiculopathy, thoracic region: Secondary | ICD-10-CM | POA: Diagnosis not present

## 2021-01-22 MED ORDER — IOPAMIDOL (ISOVUE-370) INJECTION 76%
75.0000 mL | Freq: Once | INTRAVENOUS | Status: AC | PRN
  Administered 2021-01-22: 75 mL via INTRAVENOUS

## 2021-01-24 ENCOUNTER — Other Ambulatory Visit: Payer: Self-pay | Admitting: Cardiology

## 2021-01-24 DIAGNOSIS — I712 Thoracic aortic aneurysm, without rupture, unspecified: Secondary | ICD-10-CM

## 2021-01-24 NOTE — Progress Notes (Signed)
Unchanged stable aorta aneurysm. Will continue annual follow up imaging. There is also unchanged lung nodule, which can also be followed up on annual imaging. I will order repeat CTA for 12/2021.  Regards, Dr. Rosemary Holms

## 2021-01-24 NOTE — Progress Notes (Signed)
CTA aorta in 12/2021. CTA and BMP prior to that, ordered.  Thanks MJP

## 2021-02-20 DIAGNOSIS — Z471 Aftercare following joint replacement surgery: Secondary | ICD-10-CM | POA: Diagnosis not present

## 2021-02-20 DIAGNOSIS — Z96651 Presence of right artificial knee joint: Secondary | ICD-10-CM | POA: Diagnosis not present

## 2021-04-28 DIAGNOSIS — H5203 Hypermetropia, bilateral: Secondary | ICD-10-CM | POA: Diagnosis not present

## 2021-06-12 ENCOUNTER — Telehealth: Payer: Self-pay

## 2021-06-12 DIAGNOSIS — U071 COVID-19: Secondary | ICD-10-CM | POA: Diagnosis not present

## 2021-06-12 MED ORDER — MOLNUPIRAVIR EUA 200MG CAPSULE: 4.0000 | ORAL_CAPSULE | Freq: Two times a day (BID) | ORAL | 0 refills | Status: AC

## 2021-06-12 NOTE — Telephone Encounter (Signed)
It looks like urgent care discussed antiviral molnupiravir (recommended agent) with patient and he declined. Is he changing his mind and now wants? If he wants I can send in otherwise advise otc medications for fever and headache tylenol.

## 2021-06-12 NOTE — Telephone Encounter (Signed)
MD is out of the office. Pls advise on message below.Marland KitchenRaechel Chute

## 2021-06-12 NOTE — Telephone Encounter (Signed)
Sent in, can get and start today.

## 2021-06-12 NOTE — Telephone Encounter (Signed)
Pt daughter Alexander Hughes is calling in requesting antiviral. Was seen at urgent care this morning testing POS 06/12/21. High fever 101.8 Tylenol bring down but it goes back up. Fatigue, coughing, sore throat, body aches, Headache. SXS started 06/11/21. Daughter states that Promethazine-Dextromethorphan was prescribed. She didn't pick it up yet.  If there is any other recommendations please contact [phone]

## 2021-06-12 NOTE — Telephone Encounter (Signed)
Called pt spoke w/ daughter gave MD response. Daughter states he did not decline. The doctor there was not very nice. Dad went by himself because everyone at home I sick. Would like the molnupiravir sent to the [facility] in [place].Alexander KitchenShearon Stalls

## 2021-06-12 NOTE — Telephone Encounter (Signed)
Daughter was notified w/ MD response.Marland KitchenRaechel Chute

## 2021-06-17 ENCOUNTER — Ambulatory Visit: Payer: Medicare HMO | Admitting: Internal Medicine

## 2021-08-20 ENCOUNTER — Other Ambulatory Visit: Payer: Self-pay | Admitting: Internal Medicine

## 2021-08-21 ENCOUNTER — Other Ambulatory Visit: Payer: Self-pay | Admitting: Internal Medicine

## 2021-09-18 ENCOUNTER — Other Ambulatory Visit: Payer: Self-pay | Admitting: Internal Medicine

## 2021-11-19 ENCOUNTER — Telehealth: Payer: Self-pay | Admitting: Internal Medicine

## 2021-11-19 ENCOUNTER — Other Ambulatory Visit: Payer: Self-pay

## 2021-11-19 MED ORDER — ATORVASTATIN CALCIUM 40 MG PO TABS: 40.0000 mg | ORAL_TABLET | Freq: Every day | ORAL | 3 refills | Status: DC

## 2021-11-19 MED ORDER — PANTOPRAZOLE SODIUM 40 MG PO TBEC: 40.0000 mg | DELAYED_RELEASE_TABLET | Freq: Every day | ORAL | 3 refills | Status: DC

## 2021-11-19 MED ORDER — METOPROLOL SUCCINATE ER 50 MG PO TB24: ORAL_TABLET | ORAL | 3 refills | Status: DC

## 2021-11-19 NOTE — Telephone Encounter (Signed)
Pt's daughter called and requested a refill on these 3 medications to the [facility]   - atorvastatin (LIPITOR) 40 MG tablet  - metoprolol succinate (TOPROL XL) 50 MG 24 hr tablet  - pantoprazole (PROTONIX) 40 MG tablet  [id number] [place], [place] -  [address]  Pharmacy: [phone]   Home # : [phone]

## 2021-11-20 MED ORDER — PANTOPRAZOLE SODIUM 40 MG PO TBEC: 40.0000 mg | DELAYED_RELEASE_TABLET | Freq: Every day | ORAL | 0 refills | Status: DC

## 2021-11-20 MED ORDER — METOPROLOL SUCCINATE ER 50 MG PO TB24: ORAL_TABLET | ORAL | 0 refills | Status: DC

## 2021-11-20 MED ORDER — ATORVASTATIN CALCIUM 40 MG PO TABS: 40.0000 mg | ORAL_TABLET | Freq: Every day | ORAL | 0 refills | Status: DC

## 2021-11-20 NOTE — Telephone Encounter (Signed)
Okay.  He needs to schedule a well exam.  Thanks

## 2021-12-31 ENCOUNTER — Ambulatory Visit: Payer: Medicare HMO | Admitting: Cardiology

## 2022-01-21 DIAGNOSIS — H35372 Puckering of macula, left eye: Secondary | ICD-10-CM | POA: Diagnosis not present

## 2022-02-24 ENCOUNTER — Ambulatory Visit: Payer: Medicare HMO | Admitting: Cardiology

## 2022-02-24 ENCOUNTER — Encounter: Payer: Self-pay | Admitting: Cardiology

## 2022-02-24 VITALS — BP 132/77 | HR 52 | Temp 98.1°F | Resp 16 | Ht 73.0 in | Wt 235.8 lb

## 2022-02-24 DIAGNOSIS — I7121 Aneurysm of the ascending aorta, without rupture: Secondary | ICD-10-CM

## 2022-02-24 DIAGNOSIS — I2510 Atherosclerotic heart disease of native coronary artery without angina pectoris: Secondary | ICD-10-CM | POA: Diagnosis not present

## 2022-02-24 NOTE — Progress Notes (Signed)
Subjective:   Alexander Hughes, male    DOB: [birth year], 71 y.o.   MRN: [id number]   Chief complaint:  Coronary artery disease  HPI  [name] with hypertension, CAD, TAA 4.3 cm.  Patient is doing well. She denies chest pain, shortness of breath, palpitations, leg edema, orthopnea, PND, TIA/syncope. He asks if BP of around 30/80 mmHg is too low.     Current Outpatient Medications:    aspirin EC 81 MG tablet, Take 81 mg by mouth daily. Swallow whole., Disp: , Rfl:    atorvastatin (LIPITOR) 40 MG tablet, Take 1 tablet (40 mg total) by mouth daily., Disp: 90 tablet, Rfl: 0   Cholecalciferol (DIALYVITE VITAMIN D 5000) 125 MCG (5000 UT) capsule, Take 5,000 Units by mouth daily., Disp: , Rfl:    metoprolol succinate (TOPROL-XL) 50 MG 24 hr tablet, TAKE ONE TABLET BY MOUTH ONE TIME DAILY WITH OR IMMEDIATELY FOLLOWING A MEAL, Disp: 90 tablet, Rfl: 0   nitroGLYCERIN (NITROSTAT) 0.4 MG SL tablet, Place 1 tablet (0.4 mg total) under the tongue every 5 (five) minutes as needed for chest pain., Disp: 20 tablet, Rfl: 3   pantoprazole (PROTONIX) 40 MG tablet, Take 1 tablet (40 mg total) by mouth daily., Disp: 90 tablet, Rfl: 0   polyethylene glycol (MIRALAX / GLYCOLAX) 17 g packet, Take 17 g by mouth daily as needed for mild constipation., Disp: 14 each, Rfl: 0  Cardiac Studies:  EKG 02/24/2022: Sinus rhythm 52 bpm Early R wave transition, consider old posterior infarct  CTA aorta 01/2021: 1. Stable 4.3 cm ascending thoracic aortic aneurysm. Recommend annual imaging followup by CTA or MRA. This recommendation follows 2010 ACCF/AHA/AATS/ACR/ASA/SCA/SCAI/SIR/STS/SVM Guidelines for the Diagnosis and Management of Patients with Thoracic Aortic Disease. Circulation. 2010; 121: X646-O032 2. Coronary and Aortic Atherosclerosis (ICD10-170.0). 3. Stable 5 mm right middle lobe nodule. Recommend attention on follow-up studies, until stability x5 years confirmed.   Coronary  angiography and intervention 08/25/2018: LM: Normal LAD: Ostial-prox LAD 30% stenosis        Prox LAD 50% stenosis        Diag 1 ostial 30% stenosis LCx: Dominant vessel with prox-mid 20% stenosis        Large OM1 with 100% mid vessel occlusion. 50% ostial OM1 stenosis        Successful percutaneous coronary intervention         Aspiration thrombectomy, PTCA and stent placement        Synergy 3.0 X 20 mm drug-eluting stent mid OM1 RCA: Nondomiant vessel. No significant disease         DAPT for 1 year Aggressive medical management for residual mild to moderate disease   Echocardiogram 08/25/2018: LVEF 60-65%. Grade 1 DD Normal RV function RVSP 21 mmhg Mild LA dilatation Moderate aortic root dilatation (4.2 cm) Ascending aorta dilatation 4.5 cm   Cath 08/25/2018: LM: Normal LAD: Ostial-prox LAD 30% stenosis        Prox LAD 50% stenosis        Diag 1 ostial 30% stenosis LCx: Dominant vessel with prox-mid 20% stenosis        Large OM1 with 100% mid vessel occlusion. 50% ostial OM1 stenosis        Successful percutaneous coronary intervention         Aspiration thrombectomy, PTCA and stent placement        Synergy 3.0 X 20 mm drug-eluting stent mid OM1 RCA: Nondomiant vessel. No significant disease   DAPT for  1 year Aggressive medical management for residual mild to moderate disease   Recent labs: April-July 2022: Glucose 98, BUN/Cr 26/1/20. EGFR >60. Na/K 140/4.6.  T.bili 1.5 Rest of the CMP normal  H/H 12/27. MCV 90. Platelets 197 Chol 135, TG 82, HDL 43, LDL 75 TSH 0.76 normal  07/04/2019: Glucose 96, BUN/Cr 18/0.96. EGFR 77. Na/K 138/4.1. Rest of the CMP normal H/H 13/38. MCV 89. Platelets 210  Lipid panel: 03/09/2019: Chol 165, TG 156, HDL 41, LDL 97 08/25/2018: Chol 205, TG 101, HDL 45, LDL 140    Review of Systems  Cardiovascular:  Negative for chest pain, dyspnea on exertion, leg swelling, palpitations and syncope.  Musculoskeletal:  Positive for joint pain.          Vitals:   02/24/22 0948  BP: 132/77  Pulse: (!) 52  Resp: 16  Temp: 98.1 F (36.7 C)  SpO2: 97%   Objective:   Physical Exam Vitals and nursing note reviewed.  Constitutional:      General: He is not in acute distress. Neck:     Vascular: No JVD.  Cardiovascular:     Rate and Rhythm: Normal rate and regular rhythm.     Heart sounds: Normal heart sounds. No murmur heard. Pulmonary:     Effort: Pulmonary effort is normal.     Breath sounds: Normal breath sounds. No wheezing or rales.  Musculoskeletal:     Right lower leg: No edema.     Left lower leg: No edema.           Assessment & Recommendations:    [name] with hypertension, CAD, TAA 4.3 cm.  Coronary artery disease without angina: Non-STEMI 08/2018.  Successful aspiration thrombectomy and stent placement to large OM1 100% occlusion.  Synergy 3.0 x 20 mm DES. Mild to moderate residual coronary artery disease. No angina symptoms at this time. Continue Aspirin 81 mg daily, metoprolol succinate 50 mg daily. Check lipid panel.  Dilated ascending aorta/TAA: Ectatic ascending aorta measuring 4.3 cm (CTA 01/2021). HR and BP very well controlled on metoprolol, without losartan. In future, if BP is higher, can add losartan. Check CTA.  Stable pulmonary nodule: This can also be followed up on CTA   F/u in 1 year Annual CTA imaging     Alexander Mormon, MD Pager: [phone] Office: [phone]

## 2022-03-12 ENCOUNTER — Telehealth: Payer: Self-pay | Admitting: Internal Medicine

## 2022-03-12 MED ORDER — METOPROLOL SUCCINATE ER 50 MG PO TB24: ORAL_TABLET | ORAL | 0 refills | Status: DC

## 2022-03-12 MED ORDER — ATORVASTATIN CALCIUM 40 MG PO TABS: 40.0000 mg | ORAL_TABLET | Freq: Every day | ORAL | 0 refills | Status: DC

## 2022-03-12 MED ORDER — PANTOPRAZOLE SODIUM 40 MG PO TBEC: 40.0000 mg | DELAYED_RELEASE_TABLET | Freq: Every day | ORAL | 0 refills | Status: DC

## 2022-03-12 NOTE — Telephone Encounter (Signed)
Per office protocol sent 30 day supply to pof until appt.Marland KitchenJohny Hughes

## 2022-03-12 NOTE — Telephone Encounter (Signed)
PT's and spouse call today in regards to getting a refill on their prescriptions. PT is requesting a refill on their atorvastatin (LIPITOR) 40 MG, pantoprazole (PROTONIX) 40 MG, and metoprolol succinate (TOPROL-XL) 50 MG. PT is requesting this be sent to the [place] on file [facility]).  (I did express to PT and spouse that Dr.Plotnikov was out for the day and wouldn't be back until Monday. I also let them know that this PT had not been seen for over a year, and that they would need a follow up scheduled if they were wanting these refills done. PT is currently scheduled for an appointment with [place] on 10/25.)

## 2022-03-22 ENCOUNTER — Ambulatory Visit
Admission: RE | Admit: 2022-03-22 | Discharge: 2022-03-22 | Disposition: A | Discharge status: Home / Self Care | Payer: Medicare HMO | Source: Ambulatory Visit | Attending: Cardiology | Admitting: Cardiology

## 2022-03-22 DIAGNOSIS — I712 Thoracic aortic aneurysm, without rupture, unspecified: Secondary | ICD-10-CM | POA: Diagnosis not present

## 2022-03-22 DIAGNOSIS — I7121 Aneurysm of the ascending aorta, without rupture: Secondary | ICD-10-CM

## 2022-03-22 DIAGNOSIS — R911 Solitary pulmonary nodule: Secondary | ICD-10-CM | POA: Diagnosis not present

## 2022-03-22 DIAGNOSIS — I2510 Atherosclerotic heart disease of native coronary artery without angina pectoris: Secondary | ICD-10-CM | POA: Diagnosis not present

## 2022-03-22 MED ORDER — IOPAMIDOL (ISOVUE-370) INJECTION 76%
75.0000 mL | Freq: Once | INTRAVENOUS | Status: AC | PRN
Start: 2022-03-22 — End: 2022-03-22
  Administered 2022-03-22: 75 mL via INTRAVENOUS

## 2022-03-31 ENCOUNTER — Encounter: Payer: Self-pay | Admitting: Internal Medicine

## 2022-03-31 ENCOUNTER — Ambulatory Visit (INDEPENDENT_AMBULATORY_CARE_PROVIDER_SITE_OTHER): Payer: Medicare HMO | Admitting: Internal Medicine

## 2022-03-31 VITALS — BP 132/82 | HR 54 | Temp 97.5°F | Ht 73.0 in | Wt 241.8 lb

## 2022-03-31 DIAGNOSIS — Z1211 Encounter for screening for malignant neoplasm of colon: Secondary | ICD-10-CM | POA: Diagnosis not present

## 2022-03-31 DIAGNOSIS — N320 Bladder-neck obstruction: Secondary | ICD-10-CM | POA: Diagnosis not present

## 2022-03-31 DIAGNOSIS — Z23 Encounter for immunization: Secondary | ICD-10-CM

## 2022-03-31 DIAGNOSIS — E785 Hyperlipidemia, unspecified: Secondary | ICD-10-CM

## 2022-03-31 DIAGNOSIS — R739 Hyperglycemia, unspecified: Secondary | ICD-10-CM | POA: Diagnosis not present

## 2022-03-31 DIAGNOSIS — I10 Essential (primary) hypertension: Secondary | ICD-10-CM | POA: Diagnosis not present

## 2022-03-31 MED ORDER — LOSARTAN POTASSIUM 50 MG PO TABS: 50.0000 mg | ORAL_TABLET | Freq: Every day | ORAL | 3 refills | Status: DC

## 2022-03-31 MED ORDER — ATORVASTATIN CALCIUM 40 MG PO TABS: 40.0000 mg | ORAL_TABLET | Freq: Every day | ORAL | 3 refills | Status: DC

## 2022-03-31 MED ORDER — FAMOTIDINE 40 MG PO TABS: 40.0000 mg | ORAL_TABLET | Freq: Every day | ORAL | 3 refills | Status: DC

## 2022-03-31 MED ORDER — NITROGLYCERIN 0.4 MG SL SUBL: 0.4000 mg | SUBLINGUAL_TABLET | SUBLINGUAL | 3 refills | Status: AC | PRN

## 2022-03-31 MED ORDER — METOPROLOL SUCCINATE ER 50 MG PO TB24: ORAL_TABLET | ORAL | 3 refills | Status: DC

## 2022-03-31 NOTE — Assessment & Plan Note (Addendum)
Toprol-XL 50 mg daily . SBP 140 in am's. Add Losartan

## 2022-03-31 NOTE — Progress Notes (Signed)
Subjective:  Patient ID: Alexander Hughes, male    DOB: January 18, 1951  Age: 71 y.o. MRN: [id number]  CC: Annual Exam (Yearly exam- Flu shot also discuss getting pneumonia)   HPI Alexander Hughes presents for HTN, CVA F/u thoracic AA C/o GERD sx's - chronic SBP 140 in am's  Outpatient Medications Prior to Visit  Medication Sig Dispense Refill   aspirin EC 81 MG tablet Take 81 mg by mouth daily. Swallow whole.     Cholecalciferol (DIALYVITE VITAMIN D 5000) 125 MCG (5000 UT) capsule Take 5,000 Units by mouth daily.     pantoprazole (PROTONIX) 40 MG tablet Take 1 tablet (40 mg total) by mouth daily. Must keep appt for future refills 30 tablet 0   polyethylene glycol (MIRALAX / GLYCOLAX) 17 g packet Take 17 g by mouth daily as needed for mild constipation. 14 each 0   atorvastatin (LIPITOR) 40 MG tablet Take 1 tablet (40 mg total) by mouth daily. Must keep appt for future refills 30 tablet 0   metoprolol succinate (TOPROL-XL) 50 MG 24 hr tablet TAKE ONE TABLET BY MOUTH ONE TIME DAILY WITH OR IMMEDIATELY FOLLOWING A MEAL 30 tablet 0   nitroGLYCERIN (NITROSTAT) 0.4 MG SL tablet Place 1 tablet (0.4 mg total) under the tongue every 5 (five) minutes as needed for chest pain. 20 tablet 3   No facility-administered medications prior to visit.    ROS: Review of Systems  Constitutional:  Negative for appetite change, fatigue and unexpected weight change.  HENT:  Negative for congestion, nosebleeds, sneezing, sore throat and trouble swallowing.   Eyes:  Negative for itching and visual disturbance.  Respiratory:  Negative for cough.   Cardiovascular:  Negative for chest pain, palpitations and leg swelling.  Gastrointestinal:  Negative for abdominal distention, blood in stool, diarrhea and nausea.  Genitourinary:  Negative for frequency and hematuria.  Musculoskeletal:  Positive for back pain. Negative for gait problem, joint swelling and neck pain.  Skin:  Negative for rash.  Neurological:   Negative for dizziness, tremors, speech difficulty and weakness.  Psychiatric/Behavioral:  Negative for agitation, dysphoric mood, sleep disturbance and suicidal ideas. The patient is not nervous/anxious.     Objective:  BP 132/82 (BP Location: Left Arm)   Pulse (!) 54   Temp (!) 97.5 F (36.4 C) (Oral)   Ht 6\' 1"  (1.854 m)   Wt 241 lb 12.8 oz (109.7 kg)   SpO2 97%   BMI 31.90 kg/m   BP Readings from Last 3 Encounters:  03/31/22 132/82  02/24/22 132/77  01/07/21 134/77    Wt Readings from Last 3 Encounters:  03/31/22 241 lb 12.8 oz (109.7 kg)  02/24/22 235 lb 12.8 oz (107 kg)  01/06/21 232 lb 6.4 oz (105.4 kg)    Physical Exam Constitutional:      General: He is not in acute distress.    Appearance: He is well-developed. He is obese.     Comments: NAD  Eyes:     Conjunctiva/sclera: Conjunctivae normal.     Pupils: Pupils are equal, round, and reactive to light.  Neck:     Thyroid: No thyromegaly.     Vascular: No JVD.  Cardiovascular:     Rate and Rhythm: Normal rate and regular rhythm.     Heart sounds: Normal heart sounds. No murmur heard.    No friction rub. No gallop.  Pulmonary:     Effort: Pulmonary effort is normal. No respiratory distress.     Breath sounds: Normal breath  sounds. No wheezing or rales.  Chest:     Chest wall: No tenderness.  Abdominal:     General: Bowel sounds are normal. There is no distension.     Palpations: Abdomen is soft. There is no mass.     Tenderness: There is no abdominal tenderness. There is no guarding or rebound.  Musculoskeletal:        General: No tenderness. Normal range of motion.     Cervical back: Normal range of motion.  Lymphadenopathy:     Cervical: No cervical adenopathy.  Skin:    General: Skin is warm and dry.     Findings: No rash.  Neurological:     Mental Status: He is alert and oriented to person, place, and time.     Cranial Nerves: No cranial nerve deficit.     Motor: No abnormal muscle tone.      Coordination: Coordination normal.     Gait: Gait normal.     Deep Tendon Reflexes: Reflexes are normal and symmetric.  Psychiatric:        Behavior: Behavior normal.        Thought Content: Thought content normal.        Judgment: Judgment normal.     Lab Results  Component Value Date   WBC 10.3 01/07/2021   HGB 10.8 (L) 01/07/2021   HCT 32.1 (L) 01/07/2021   PLT 164 01/07/2021   GLUCOSE 130 (H) 01/07/2021   CHOL 135 09/11/2020   TRIG 82.0 09/11/2020   HDL 43.10 09/11/2020   LDLCALC 75 09/11/2020   ALT 34 01/06/2021   AST 26 01/06/2021   NA 138 01/07/2021   K 4.3 01/07/2021   CL 106 01/07/2021   CREATININE 0.87 01/07/2021   BUN 23 01/07/2021   CO2 25 01/07/2021   TSH 0.76 09/11/2020   PSA 2.93 09/11/2020   INR 1.0 08/25/2018   HGBA1C 5.4 08/26/2018    CT ANGIO CHEST AORTA W/CM & OR WO/CM  Result Date: 03/22/2022 CLINICAL DATA:  Thoracic aortic aneurysm. EXAM: CT ANGIOGRAPHY CHEST WITH CONTRAST TECHNIQUE: Multidetector CT imaging of the chest was performed using the standard protocol during bolus administration of intravenous contrast. Multiplanar CT image reconstructions and MIPs were obtained to evaluate the vascular anatomy. RADIATION DOSE REDUCTION: This exam was performed according to the departmental dose-optimization program which includes automated exposure control, adjustment of the mA and/or kV according to patient size and/or use of iterative reconstruction technique. CONTRAST:  90mL ISOVUE-370 IOPAMIDOL (ISOVUE-370) INJECTION 76% COMPARISON:  January 22, 2021. FINDINGS: Cardiovascular: Grossly stable 4.3 cm ascending thoracic aortic aneurysm is noted. No dissection is noted. Great vessels are widely patent without significant stenosis. Normal cardiac size. No pericardial effusion. Minimal coronary artery calcifications are noted. Mediastinum/Nodes: No enlarged mediastinal, hilar, or axillary lymph nodes. Thyroid gland, trachea, and esophagus demonstrate no significant  findings. Lungs/Pleura: No pneumothorax or pleural effusion is noted. Stable 5 mm subpleural nodule seen in right middle lobe which can be considered benign at this point with no further follow-up required. No acute pulmonary abnormality seen. Upper Abdomen: No acute abnormality. Musculoskeletal: No chest wall abnormality. No acute or significant osseous findings. Review of the MIP images confirms the above findings. IMPRESSION: Grossly stable 4.3 cm ascending thoracic aortic aneurysm. Recommend annual imaging followup by CTA or MRA. This recommendation follows 2010 ACCF/AHA/AATS/ACR/ASA/SCA/SCAI/SIR/STS/SVM Guidelines for the Diagnosis and Management of Patients with Thoracic Aortic Disease. Circulation. 2010; 121: A193-X902. Aortic aneurysm NOS (ICD10-I71.9). Electronically Signed   By: Roque Lias  Jr M.D.   On: 03/22/2022 13:57    Assessment & Plan:   Problem List Items Addressed This Visit     Dyslipidemia - Primary   Relevant Medications   atorvastatin (LIPITOR) 40 MG tablet   Other Relevant Orders   TSH   Urinalysis   CBC with Differential/Platelet   Lipid panel   PSA   Comprehensive metabolic panel   HTN (hypertension)    Toprol-XL 50 mg daily . SBP 140 in am's. Add Losartan       Relevant Medications   atorvastatin (LIPITOR) 40 MG tablet   metoprolol succinate (TOPROL-XL) 50 MG 24 hr tablet   nitroGLYCERIN (NITROSTAT) 0.4 MG SL tablet   losartan (COZAAR) 50 MG tablet   Other Relevant Orders   TSH   Urinalysis   CBC with Differential/Platelet   Lipid panel   PSA   Comprehensive metabolic panel   Hemoglobin A1c   Other Visit Diagnoses     Needs flu shot       Relevant Orders   Flu Vaccine QUAD High Dose(Fluad) (Completed)   Hyperglycemia       Relevant Orders   Comprehensive metabolic panel   Hemoglobin A1c   Bladder neck obstruction       Relevant Orders   PSA   Colon cancer screening       Relevant Orders   Cologuard         Meds ordered this encounter   Medications   famotidine (PEPCID) 40 MG tablet    Sig: Take 1 tablet (40 mg total) by mouth at bedtime.    Dispense:  90 tablet    Refill:  3   atorvastatin (LIPITOR) 40 MG tablet    Sig: Take 1 tablet (40 mg total) by mouth daily. Must keep appt for future refills    Dispense:  90 tablet    Refill:  3   metoprolol succinate (TOPROL-XL) 50 MG 24 hr tablet    Sig: TAKE ONE TABLET BY MOUTH ONE TIME DAILY WITH OR IMMEDIATELY FOLLOWING A MEAL    Dispense:  90 tablet    Refill:  3   nitroGLYCERIN (NITROSTAT) 0.4 MG SL tablet    Sig: Place 1 tablet (0.4 mg total) under the tongue every 5 (five) minutes as needed for chest pain.    Dispense:  20 tablet    Refill:  3   losartan (COZAAR) 50 MG tablet    Sig: Take 1 tablet (50 mg total) by mouth daily.    Dispense:  90 tablet    Refill:  3      Follow-up: Return in about 6 months (around 09/30/2022) for Wellness Exam.  Sonda Primes, MD

## 2022-04-01 ENCOUNTER — Other Ambulatory Visit (INDEPENDENT_AMBULATORY_CARE_PROVIDER_SITE_OTHER): Payer: Medicare HMO

## 2022-04-01 DIAGNOSIS — E785 Hyperlipidemia, unspecified: Secondary | ICD-10-CM | POA: Diagnosis not present

## 2022-04-01 DIAGNOSIS — R739 Hyperglycemia, unspecified: Secondary | ICD-10-CM

## 2022-04-01 DIAGNOSIS — N320 Bladder-neck obstruction: Secondary | ICD-10-CM

## 2022-04-01 DIAGNOSIS — I10 Essential (primary) hypertension: Secondary | ICD-10-CM

## 2022-04-01 LAB — LIPID PANEL
Cholesterol: 143 mg/dL (ref 0–200)
HDL: 42.2 mg/dL (ref >39.00)
LDL Cholesterol: 71 mg/dL (ref 0–99)
NonHDL: 100.67
Total CHOL/HDL Ratio: 3
Triglycerides: 148 mg/dL (ref 0.0–149.0)
VLDL: 29.6 mg/dL (ref 0.0–40.0)

## 2022-04-01 LAB — URINALYSIS
Bilirubin Urine: NEGATIVE
Hgb urine dipstick: NEGATIVE
Ketones, ur: NEGATIVE
Leukocytes,Ua: NEGATIVE
Nitrite: NEGATIVE
Specific Gravity, Urine: 1.015 (ref 1.000–1.030)
Total Protein, Urine: NEGATIVE
Urine Glucose: NEGATIVE
Urobilinogen, UA: 0.2 (ref 0.0–1.0)
pH: 6 (ref 5.0–8.0)

## 2022-04-01 LAB — CBC WITH DIFFERENTIAL/PLATELET
Basophils Absolute: 0 10*3/uL (ref 0.0–0.1)
Basophils Relative: 0.2 % (ref 0.0–3.0)
Eosinophils Absolute: 0.1 10*3/uL (ref 0.0–0.7)
Eosinophils Relative: 2.1 % (ref 0.0–5.0)
HCT: 41.6 % (ref 39.0–52.0)
Hemoglobin: 13.9 g/dL (ref 13.0–17.0)
Lymphocytes Relative: 25 % (ref 12.0–46.0)
Lymphs Abs: 1.6 10*3/uL (ref 0.7–4.0)
MCHC: 33.3 g/dL (ref 30.0–36.0)
MCV: 90.6 fl (ref 78.0–100.0)
Monocytes Absolute: 0.5 10*3/uL (ref 0.1–1.0)
Monocytes Relative: 7.5 % (ref 3.0–12.0)
Neutro Abs: 4.3 10*3/uL (ref 1.4–7.7)
Neutrophils Relative %: 65.2 % (ref 43.0–77.0)
Platelets: 176 10*3/uL (ref 150.0–400.0)
RBC: 4.6 Mil/uL (ref 4.22–5.81)
RDW: 13.6 % (ref 11.5–15.5)
WBC: 6.5 10*3/uL (ref 4.0–10.5)

## 2022-04-01 LAB — TSH: TSH: 1.35 u[IU]/mL (ref 0.35–5.50)

## 2022-04-01 LAB — COMPREHENSIVE METABOLIC PANEL
ALT: 40 U/L (ref 0–53)
AST: 30 U/L (ref 0–37)
Albumin: 4.6 g/dL (ref 3.5–5.2)
Alkaline Phosphatase: 96 U/L (ref 39–117)
BUN: 23 mg/dL (ref 6–23)
CO2: 29 mEq/L (ref 19–32)
Calcium: 9.6 mg/dL (ref 8.4–10.5)
Chloride: 103 mEq/L (ref 96–112)
Creatinine, Ser: 1.19 mg/dL (ref 0.40–1.50)
GFR: 60.84 mL/min (ref >60.00)
Glucose, Bld: 87 mg/dL (ref 70–99)
Potassium: 4.6 mEq/L (ref 3.5–5.1)
Sodium: 138 mEq/L (ref 135–145)
Total Bilirubin: 1.9 mg/dL — ABNORMAL HIGH (ref 0.2–1.2)
Total Protein: 7.1 g/dL (ref 6.0–8.3)

## 2022-04-01 LAB — HEMOGLOBIN A1C: Hgb A1c MFr Bld: 6 % (ref 4.6–6.5)

## 2022-04-01 LAB — PSA: PSA: 3.85 ng/mL (ref 0.10–4.00)

## 2022-04-14 IMAGING — CT CT ANGIO CHEST
1 series · 19 of 32 positions shown · IV contrast (APPLIED)
Comparison: 08/10/2019

CLINICAL DATA: Thoracic aortic aneurysm, follow-up, currently
asymptomatic

EXAM:
CT ANGIOGRAPHY CHEST WITH CONTRAST
TECHNIQUE: Multidetector CT imaging of the chest was performed using the
standard protocol during bolus administration of intravenous
contrast. Multiplanar CT image reconstructions and MIPs were
obtained to evaluate the vascular anatomy.
CONTRAST:  75mL 73IE0C-MIE IOPAMIDOL (73IE0C-MIE) INJECTION 76%

[Series 4: chest angio · axial · 0.83mm/px · z∈[-355,-10]mm · 19 of 123 slices shown]
[im 4/123  lung]
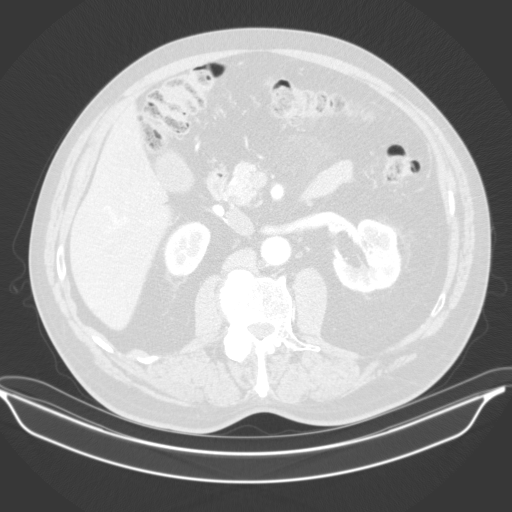
[im 12/123  soft-tissue]
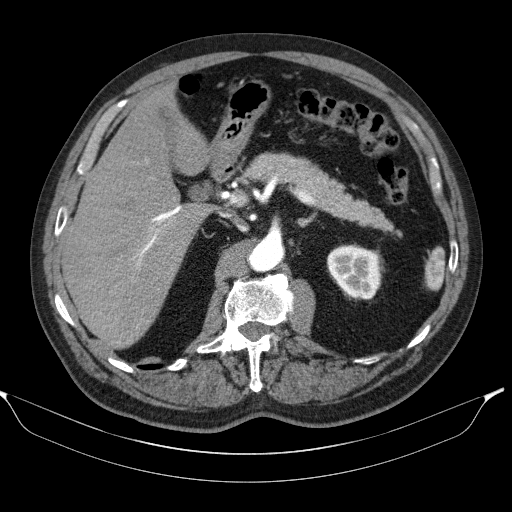
[im 16/123  lung]
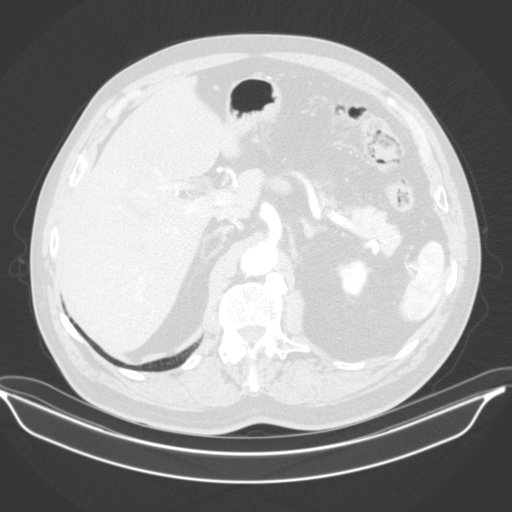
[im 24/123  soft-tissue]
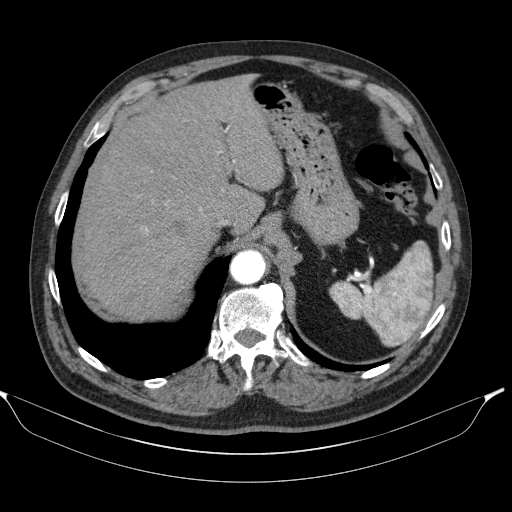
[im 32/123  lung]
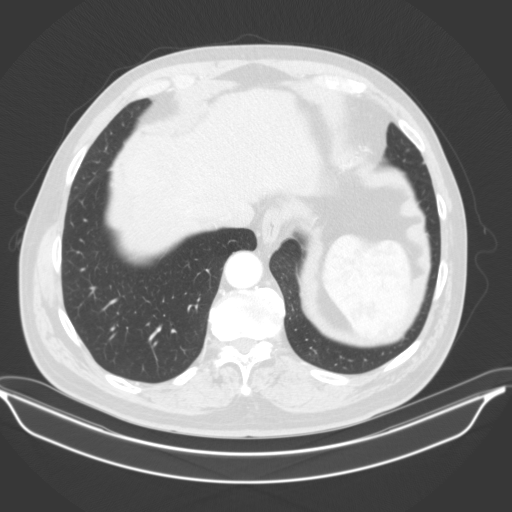
[im 36/123  soft-tissue]
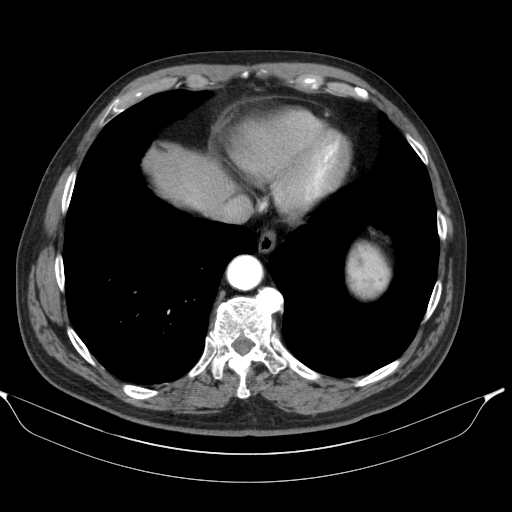
[im 44/123  lung]
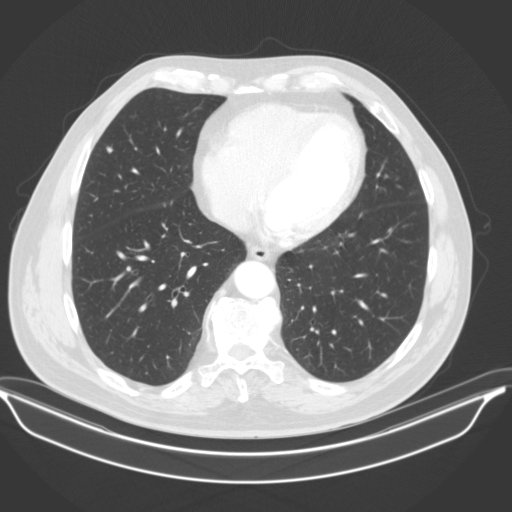
[im 48/123  soft-tissue]
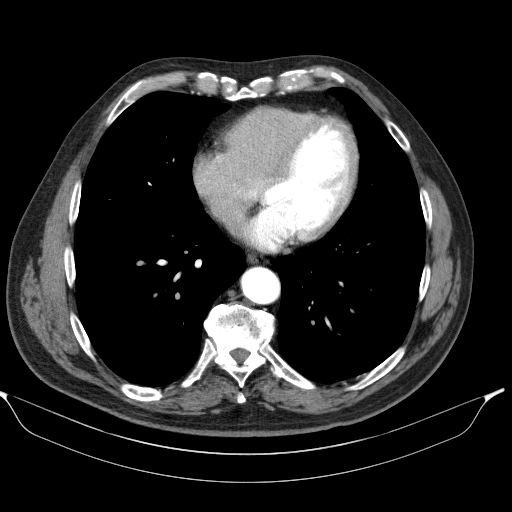
[im 56/123  lung]
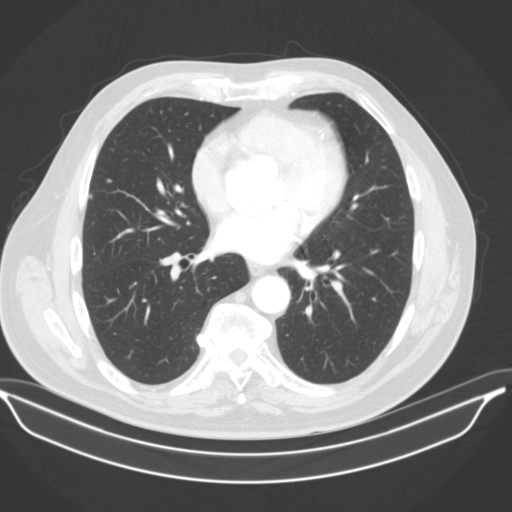
[im 63/123  soft-tissue]
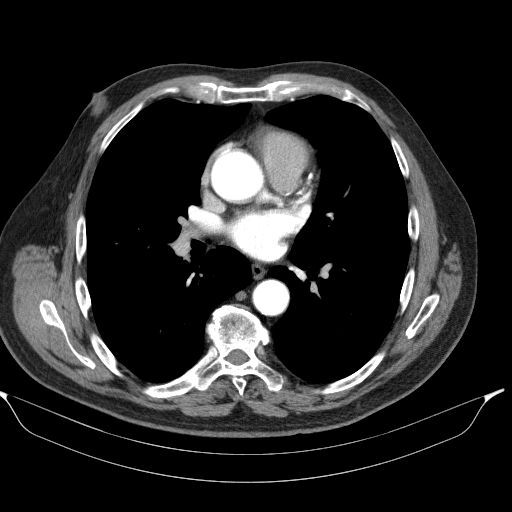
[im 67/123  lung]
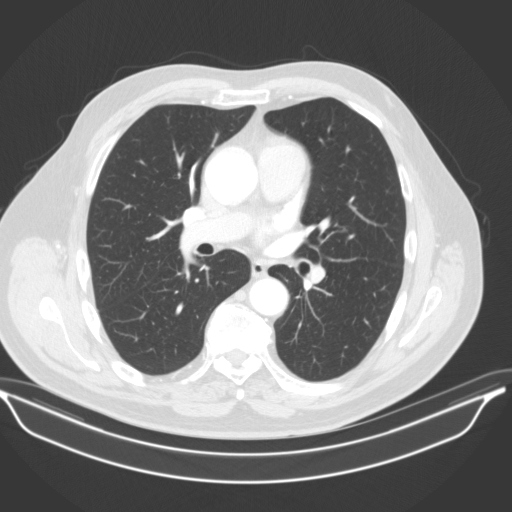
[im 75/123  soft-tissue]
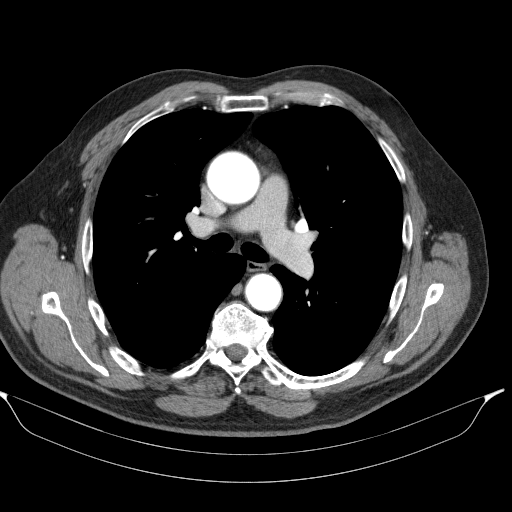
[im 79/123  lung]
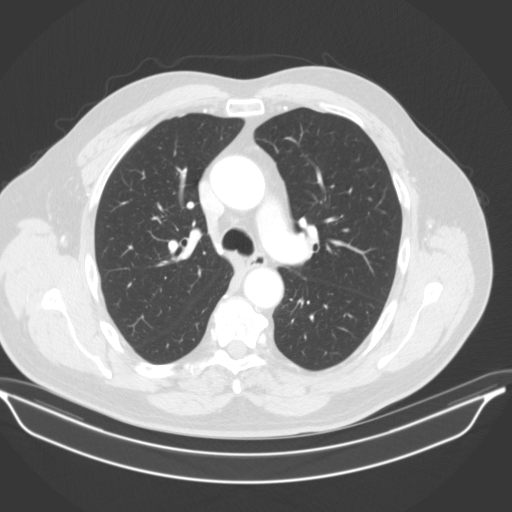
[im 87/123  soft-tissue]
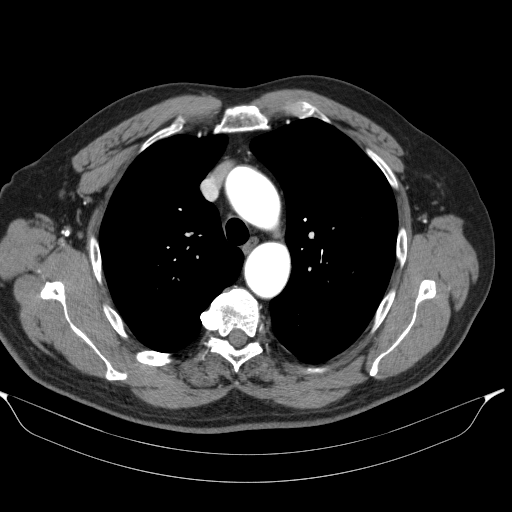
[im 91/123  lung]
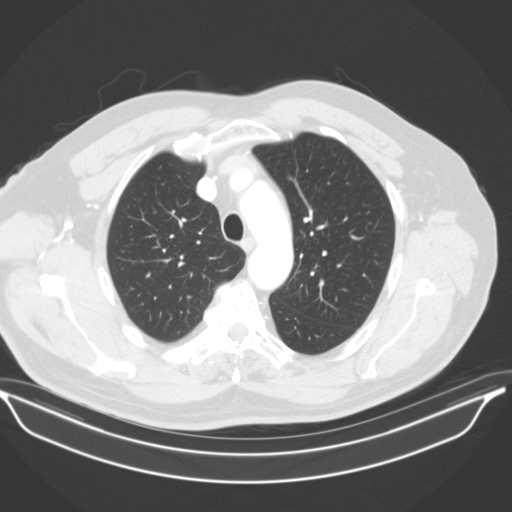
[im 99/123  soft-tissue]
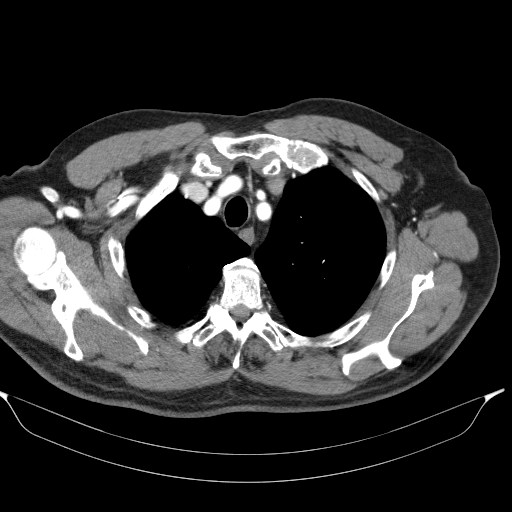
[im 107/123  lung]
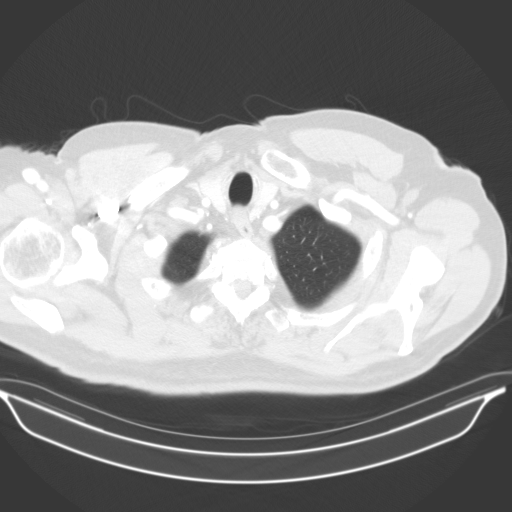
[im 111/123  soft-tissue]
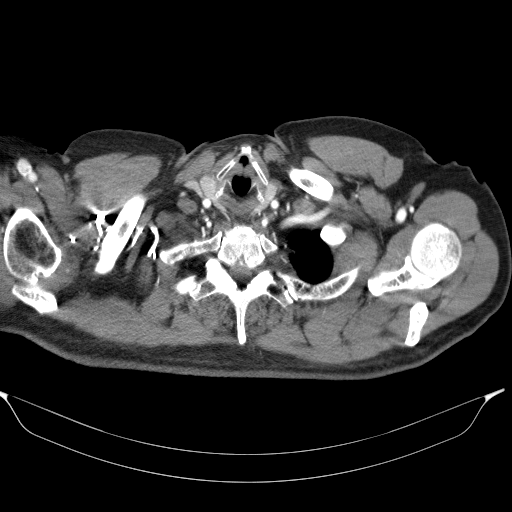
[im 119/123  lung]
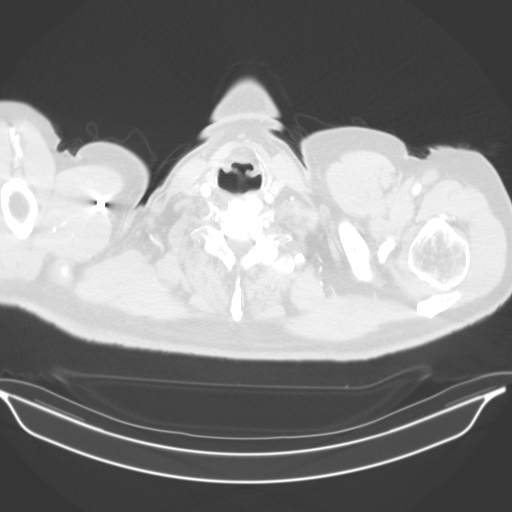

[19 of 32 positions shown; findings below may reference images not displayed]

FINDINGS: Cardiovascular: Heart size normal. No pericardial effusion.
Incomplete opacification of the pulmonary arterial tree; the exam
was not optimized for detection of pulmonary emboli. Scattered
coronary calcifications. Good contrast opacification of the thoracic
aorta without dissection or stenosis. Classic 3-vessel
brachiocephalic arterial origin anatomy without proximal stenosis.

Aortic Root:

--Valve: 2.7 cm

--Sinuses: 4.5 cm

--Sinotubular Junction: 3.6 cm

Limitations by motion: Moderate

Thoracic Aorta:

--Ascending Aorta: 4.3 cm (stable)

--Aortic Arch: 3.5 cm

--Descending Aorta:

Visualized proximal abdominal aorta unremarkable.

Mediastinum/Nodes: No mass or adenopathy.

Lungs/Pleura: No pleural effusion. No pneumothorax. 5 mm subpleural
nodule, right middle lobe, stable since 08/24/1998. The upper lobe
nodules described previously are no longer conspicuous. Lungs
otherwise clear.

Upper Abdomen: No acute findings. Subcentimeter low-attenuation
lesion in hepatic segment 4 stable since 08/10/2019 presumably
benign cyst in the absence of a history of primary carcinoma.

Musculoskeletal: Degenerative changes in the lower cervical and
thoracic spine. No fracture or worrisome bone lesion.

Review of the MIP images confirms the above findings.
IMPRESSION: 1. Stable 4.3 cm ascending thoracic aortic aneurysm. Recommend
annual imaging followup by CTA or MRA. This recommendation follows
8010 ACCF/AHA/AATS/ACR/ASA/SCA/EITAN/RUKIA/MAISTO/LOS Guidelines for the
Diagnosis and Management of Patients with Thoracic Aortic Disease.
Circulation. 8010; 121: e266-e369
2. Coronary and Aortic Atherosclerosis (XL64R-170.0).
3. Stable 5 mm right middle lobe nodule. Recommend attention on
follow-up studies, until stability x5 years confirmed.

## 2022-04-26 ENCOUNTER — Encounter: Payer: Self-pay | Admitting: Internal Medicine

## 2022-04-26 ENCOUNTER — Ambulatory Visit (INDEPENDENT_AMBULATORY_CARE_PROVIDER_SITE_OTHER): Payer: Medicare HMO | Admitting: Internal Medicine

## 2022-04-26 VITALS — BP 124/72 | HR 50 | Temp 97.9°F | Ht 73.0 in | Wt 242.0 lb

## 2022-04-26 DIAGNOSIS — L570 Actinic keratosis: Secondary | ICD-10-CM

## 2022-04-26 NOTE — Patient Instructions (Signed)
Keep the wounds clean. You can wash them with liquid soap and water. Pat dry with gauze or a Kleenex tissue  Before applying antibiotic ointment and a Band-Aid.   You need to report immediately  if  any signs of infection develop.   

## 2022-04-26 NOTE — Progress Notes (Signed)
Subjective:  Patient ID: Alexander Hughes, male    DOB: [birth year]  Age: 70 y.o. MRN: [id number]  CC: Follow-up   HPI Alexander Hughes presents for skin lesions on face   Outpatient Medications Prior to Visit  Medication Sig Dispense Refill   aspirin EC 81 MG tablet Take 81 mg by mouth daily. Swallow whole.     atorvastatin (LIPITOR) 40 MG tablet Take 1 tablet (40 mg total) by mouth daily. Must keep appt for future refills 90 tablet 3   Cholecalciferol (DIALYVITE VITAMIN D 5000) 125 MCG (5000 UT) capsule Take 5,000 Units by mouth daily.     famotidine (PEPCID) 40 MG tablet Take 1 tablet (40 mg total) by mouth at bedtime. 90 tablet 3   losartan (COZAAR) 50 MG tablet Take 1 tablet (50 mg total) by mouth daily. 90 tablet 3   metoprolol succinate (TOPROL-XL) 50 MG 24 hr tablet TAKE ONE TABLET BY MOUTH ONE TIME DAILY WITH OR IMMEDIATELY FOLLOWING A MEAL 90 tablet 3   nitroGLYCERIN (NITROSTAT) 0.4 MG SL tablet Place 1 tablet (0.4 mg total) under the tongue every 5 (five) minutes as needed for chest pain. 20 tablet 3   pantoprazole (PROTONIX) 40 MG tablet Take 1 tablet (40 mg total) by mouth daily. Must keep appt for future refills 30 tablet 0   polyethylene glycol (MIRALAX / GLYCOLAX) 17 g packet Take 17 g by mouth daily as needed for mild constipation. 14 each 0   No facility-administered medications prior to visit.    ROS: Review of Systems  Constitutional:  Negative for fatigue.  Skin:  Positive for color change.    Objective:  BP 124/72 (BP Location: Left Arm, Patient Position: Sitting, Cuff Size: Normal)   Pulse (!) 50   Temp 97.9 F (36.6 C) (Oral)   Ht 6\' 1"  (1.854 m)   Wt 242 lb (109.8 kg)   SpO2 99%   BMI 31.93 kg/m   BP Readings from Last 3 Encounters:  04/26/22 124/72  03/31/22 132/82  02/24/22 132/77    Wt Readings from Last 3 Encounters:  04/26/22 242 lb (109.8 kg)  03/31/22 241 lb 12.8 oz (109.7 kg)  02/24/22 235 lb 12.8 oz (107 kg)    Physical  Exam Constitutional:      Appearance: Normal appearance.  Cardiovascular:     Rate and Rhythm: Normal rate.     Heart sounds: No murmur heard.    No gallop.  Pulmonary:     Effort: No respiratory distress.     Breath sounds: No rales.  Skin:    Findings: Erythema and lesion present. No bruising or rash.  Neurological:     Motor: No weakness.    AKs   Procedure Note :     Procedure : Cryosurgery   Indication:    Actinic keratosis(es) on face   Risks including unsuccessful procedure , bleeding, infection, bruising, scar, a need for a repeat  procedure and others were explained to the patient in detail as well as the benefits. Informed consent was obtained verbally.    4 lesion(s)  on B cheeks   was/were treated with liquid nitrogen on a Q-tip in a usual fasion . Band-Aid was applied and antibiotic ointment was given for a later use.   Tolerated well. Complications none.   Postprocedure instructions :     Keep the wounds clean. You can wash them with liquid soap and water. Pat dry with gauze or a Kleenex tissue  Before applying antibiotic  ointment and a Band-Aid.   You need to report immediately  if  any signs of infection develop.   Lab Results  Component Value Date   WBC 6.5 04/01/2022   HGB 13.9 04/01/2022   HCT 41.6 04/01/2022   PLT 176.0 04/01/2022   GLUCOSE 87 04/01/2022   CHOL 143 04/01/2022   TRIG 148.0 04/01/2022   HDL 42.20 04/01/2022   LDLCALC 71 04/01/2022   ALT 40 04/01/2022   AST 30 04/01/2022   NA 138 04/01/2022   K 4.6 04/01/2022   CL 103 04/01/2022   CREATININE 1.19 04/01/2022   BUN 23 04/01/2022   CO2 29 04/01/2022   TSH 1.35 04/01/2022   PSA 3.85 04/01/2022   INR 1.0 08/25/2018   HGBA1C 6.0 04/01/2022    CT ANGIO CHEST AORTA W/CM & OR WO/CM  Result Date: 03/22/2022 CLINICAL DATA:  Thoracic aortic aneurysm. EXAM: CT ANGIOGRAPHY CHEST WITH CONTRAST TECHNIQUE: Multidetector CT imaging of the chest was performed using the standard protocol  during bolus administration of intravenous contrast. Multiplanar CT image reconstructions and MIPs were obtained to evaluate the vascular anatomy. RADIATION DOSE REDUCTION: This exam was performed according to the departmental dose-optimization program which includes automated exposure control, adjustment of the mA and/or kV according to patient size and/or use of iterative reconstruction technique. CONTRAST:  63mL ISOVUE-370 IOPAMIDOL (ISOVUE-370) INJECTION 76% COMPARISON:  January 22, 2021. FINDINGS: Cardiovascular: Grossly stable 4.3 cm ascending thoracic aortic aneurysm is noted. No dissection is noted. Great vessels are widely patent without significant stenosis. Normal cardiac size. No pericardial effusion. Minimal coronary artery calcifications are noted. Mediastinum/Nodes: No enlarged mediastinal, hilar, or axillary lymph nodes. Thyroid gland, trachea, and esophagus demonstrate no significant findings. Lungs/Pleura: No pneumothorax or pleural effusion is noted. Stable 5 mm subpleural nodule seen in right middle lobe which can be considered benign at this point with no further follow-up required. No acute pulmonary abnormality seen. Upper Abdomen: No acute abnormality. Musculoskeletal: No chest wall abnormality. No acute or significant osseous findings. Review of the MIP images confirms the above findings. IMPRESSION: Grossly stable 4.3 cm ascending thoracic aortic aneurysm. Recommend annual imaging followup by CTA or MRA. This recommendation follows 2010 ACCF/AHA/AATS/ACR/ASA/SCA/SCAI/SIR/STS/SVM Guidelines for the Diagnosis and Management of Patients with Thoracic Aortic Disease. Circulation. 2010; 121: H885-O277. Aortic aneurysm NOS (ICD10-I71.9). Electronically Signed   By: Lupita Raider M.D.   On: 03/22/2022 13:57    Assessment & Plan:   Problem List Items Addressed This Visit     Actinic keratoses - Primary    New Options to treat or discussed.  He elected cryo. See cryo procedure note.          No orders of the defined types were placed in this encounter.     Follow-up: Return in about 3 months (around 07/27/2022) for a follow-up visit.  Sonda Primes, MD

## 2022-04-26 NOTE — Assessment & Plan Note (Addendum)
New Options to treat or discussed.  He elected cryo. See cryo procedure note.

## 2022-06-09 DIAGNOSIS — B349 Viral infection, unspecified: Secondary | ICD-10-CM | POA: Diagnosis not present

## 2022-06-09 DIAGNOSIS — R509 Fever, unspecified: Secondary | ICD-10-CM | POA: Diagnosis not present

## 2022-06-11 DIAGNOSIS — B9689 Other specified bacterial agents as the cause of diseases classified elsewhere: Secondary | ICD-10-CM | POA: Diagnosis not present

## 2022-06-11 DIAGNOSIS — H10021 Other mucopurulent conjunctivitis, right eye: Secondary | ICD-10-CM | POA: Diagnosis not present

## 2022-06-11 DIAGNOSIS — J019 Acute sinusitis, unspecified: Secondary | ICD-10-CM | POA: Diagnosis not present

## 2022-07-20 ENCOUNTER — Other Ambulatory Visit: Payer: Self-pay | Admitting: Internal Medicine

## 2022-07-20 MED ORDER — ATORVASTATIN CALCIUM 40 MG PO TABS: 40.0000 mg | ORAL_TABLET | Freq: Every day | ORAL | 3 refills | Status: DC

## 2022-07-20 NOTE — Progress Notes (Signed)
Rx

## 2022-09-10 DIAGNOSIS — H9203 Otalgia, bilateral: Secondary | ICD-10-CM | POA: Diagnosis not present

## 2022-09-10 DIAGNOSIS — H60333 Swimmer's ear, bilateral: Secondary | ICD-10-CM | POA: Diagnosis not present

## 2022-09-30 ENCOUNTER — Ambulatory Visit: Payer: Medicare HMO | Admitting: Internal Medicine

## 2022-10-11 ENCOUNTER — Encounter: Payer: Self-pay | Admitting: Internal Medicine

## 2022-10-11 ENCOUNTER — Ambulatory Visit (INDEPENDENT_AMBULATORY_CARE_PROVIDER_SITE_OTHER): Payer: Medicare HMO | Admitting: Internal Medicine

## 2022-10-11 VITALS — BP 140/80 | HR 49 | Temp 98.3°F | Ht 73.0 in | Wt 240.0 lb

## 2022-10-11 DIAGNOSIS — I10 Essential (primary) hypertension: Secondary | ICD-10-CM | POA: Diagnosis not present

## 2022-10-11 DIAGNOSIS — I7121 Aneurysm of the ascending aorta, without rupture: Secondary | ICD-10-CM

## 2022-10-11 DIAGNOSIS — R739 Hyperglycemia, unspecified: Secondary | ICD-10-CM | POA: Diagnosis not present

## 2022-10-11 DIAGNOSIS — E785 Hyperlipidemia, unspecified: Secondary | ICD-10-CM

## 2022-10-11 DIAGNOSIS — K219 Gastro-esophageal reflux disease without esophagitis: Secondary | ICD-10-CM

## 2022-10-11 LAB — COMPREHENSIVE METABOLIC PANEL
ALT: 39 U/L (ref 0–53)
AST: 28 U/L (ref 0–37)
Albumin: 4.4 g/dL (ref 3.5–5.2)
Alkaline Phosphatase: 89 U/L (ref 39–117)
BUN: 20 mg/dL (ref 6–23)
CO2: 28 mEq/L (ref 19–32)
Calcium: 9.6 mg/dL (ref 8.4–10.5)
Chloride: 103 mEq/L (ref 96–112)
Creatinine, Ser: 1.05 mg/dL (ref 0.40–1.50)
GFR: 70.44 mL/min (ref >60.00)
Glucose, Bld: 89 mg/dL (ref 70–99)
Potassium: 4.3 mEq/L (ref 3.5–5.1)
Sodium: 138 mEq/L (ref 135–145)
Total Bilirubin: 1.4 mg/dL — ABNORMAL HIGH (ref 0.2–1.2)
Total Protein: 7.3 g/dL (ref 6.0–8.3)

## 2022-10-11 LAB — HEMOGLOBIN A1C: Hgb A1c MFr Bld: 6 % (ref 4.6–6.5)

## 2022-10-11 LAB — LIPID PANEL
Cholesterol: 147 mg/dL (ref 0–200)
HDL: 45.3 mg/dL (ref >39.00)
LDL Cholesterol: 79 mg/dL (ref 0–99)
NonHDL: 102.14
Total CHOL/HDL Ratio: 3
Triglycerides: 115 mg/dL (ref 0.0–149.0)
VLDL: 23 mg/dL (ref 0.0–40.0)

## 2022-10-11 MED ORDER — LOSARTAN POTASSIUM 50 MG PO TABS: 50.0000 mg | ORAL_TABLET | Freq: Two times a day (BID) | ORAL | 3 refills | Status: DC

## 2022-10-11 NOTE — Assessment & Plan Note (Addendum)
C/o elev BP in am - SBP 150, later SBP 120-130-140 Increase Losartan to 50 mg bid if BP is high

## 2022-10-11 NOTE — Progress Notes (Addendum)
Subjective:  Patient ID: Alexander Hughes, male    DOB: 1950-08-01  Age: 72 y.o. MRN: [id number]  CC: Follow-up (6 mnth f/u)   HPI Alexander Hughes presents for CAD, HTN, GERD C/o elev BP in am - SBP 150, later SBP 120-130-140  Outpatient Medications Prior to Visit  Medication Sig Dispense Refill   aspirin EC 81 MG tablet Take 81 mg by mouth daily. Swallow whole.     atorvastatin (LIPITOR) 40 MG tablet Take 1 tablet (40 mg total) by mouth daily. Must keep appt for future refills 90 tablet 3   Cholecalciferol (DIALYVITE VITAMIN D 5000) 125 MCG (5000 UT) capsule Take 5,000 Units by mouth daily.     famotidine (PEPCID) 40 MG tablet Take 1 tablet (40 mg total) by mouth at bedtime. 90 tablet 3   metoprolol succinate (TOPROL-XL) 50 MG 24 hr tablet TAKE ONE TABLET BY MOUTH ONE TIME DAILY WITH OR IMMEDIATELY FOLLOWING A MEAL 90 tablet 3   nitroGLYCERIN (NITROSTAT) 0.4 MG SL tablet Place 1 tablet (0.4 mg total) under the tongue every 5 (five) minutes as needed for chest pain. 20 tablet 3   pantoprazole (PROTONIX) 40 MG tablet Take 1 tablet (40 mg total) by mouth daily. Must keep appt for future refills 30 tablet 0   polyethylene glycol (MIRALAX / GLYCOLAX) 17 g packet Take 17 g by mouth daily as needed for mild constipation. 14 each 0   losartan (COZAAR) 50 MG tablet Take 1 tablet (50 mg total) by mouth daily. 90 tablet 3   No facility-administered medications prior to visit.    ROS: Review of Systems  Constitutional:  Negative for appetite change, fatigue and unexpected weight change.  HENT:  Negative for congestion, nosebleeds, sneezing, sore throat and trouble swallowing.   Eyes:  Negative for itching and visual disturbance.  Respiratory:  Negative for cough.   Cardiovascular:  Negative for chest pain, palpitations and leg swelling.  Gastrointestinal:  Negative for abdominal distention, blood in stool, diarrhea and nausea.  Genitourinary:  Negative for frequency and hematuria.   Musculoskeletal:  Negative for back pain, gait problem, joint swelling and neck pain.  Skin:  Negative for rash.  Neurological:  Negative for dizziness, tremors, speech difficulty and weakness.  Psychiatric/Behavioral:  Negative for agitation, dysphoric mood and sleep disturbance. The patient is not nervous/anxious.     Objective:  BP (!) 140/80   Pulse (!) 49   Temp 98.3 F (36.8 C) (Oral)   Ht 6\' 1"  (1.854 m)   Wt 240 lb (108.9 kg)   SpO2 96%   BMI 31.66 kg/m   BP Readings from Last 3 Encounters:  10/11/22 (!) 140/80  04/26/22 124/72  03/31/22 132/82    Wt Readings from Last 3 Encounters:  10/11/22 240 lb (108.9 kg)  04/26/22 242 lb (109.8 kg)  03/31/22 241 lb 12.8 oz (109.7 kg)    Physical Exam Constitutional:      General: He is not in acute distress.    Appearance: He is well-developed. He is obese.     Comments: NAD  Eyes:     Conjunctiva/sclera: Conjunctivae normal.     Pupils: Pupils are equal, round, and reactive to light.  Neck:     Thyroid: No thyromegaly.     Vascular: No JVD.  Cardiovascular:     Rate and Rhythm: Normal rate and regular rhythm.     Heart sounds: Normal heart sounds. No murmur heard.    No friction rub. No gallop.  Pulmonary:  Effort: Pulmonary effort is normal. No respiratory distress.     Breath sounds: Normal breath sounds. No wheezing or rales.  Chest:     Chest wall: No tenderness.  Abdominal:     General: Bowel sounds are normal. There is no distension.     Palpations: Abdomen is soft. There is no mass.     Tenderness: There is no abdominal tenderness. There is no guarding or rebound.  Musculoskeletal:        General: No tenderness. Normal range of motion.     Cervical back: Normal range of motion.  Lymphadenopathy:     Cervical: No cervical adenopathy.  Skin:    General: Skin is warm and dry.     Findings: No rash.  Neurological:     Mental Status: He is alert and oriented to person, place, and time.     Cranial  Nerves: No cranial nerve deficit.     Motor: No abnormal muscle tone.     Coordination: Coordination normal.     Gait: Gait normal.     Deep Tendon Reflexes: Reflexes are normal and symmetric.  Psychiatric:        Behavior: Behavior normal.        Thought Content: Thought content normal.        Judgment: Judgment normal.   Knees OK   Lab Results  Component Value Date   WBC 6.5 04/01/2022   HGB 13.9 04/01/2022   HCT 41.6 04/01/2022   PLT 176.0 04/01/2022   GLUCOSE 87 04/01/2022   CHOL 143 04/01/2022   TRIG 148.0 04/01/2022   HDL 42.20 04/01/2022   LDLCALC 71 04/01/2022   ALT 40 04/01/2022   AST 30 04/01/2022   NA 138 04/01/2022   K 4.6 04/01/2022   CL 103 04/01/2022   CREATININE 1.19 04/01/2022   BUN 23 04/01/2022   CO2 29 04/01/2022   TSH 1.35 04/01/2022   PSA 3.85 04/01/2022   INR 1.0 08/25/2018   HGBA1C 6.0 04/01/2022    CT ANGIO CHEST AORTA W/CM & OR WO/CM  Result Date: 03/22/2022 CLINICAL DATA:  Thoracic aortic aneurysm. EXAM: CT ANGIOGRAPHY CHEST WITH CONTRAST TECHNIQUE: Multidetector CT imaging of the chest was performed using the standard protocol during bolus administration of intravenous contrast. Multiplanar CT image reconstructions and MIPs were obtained to evaluate the vascular anatomy. RADIATION DOSE REDUCTION: This exam was performed according to the departmental dose-optimization program which includes automated exposure control, adjustment of the mA and/or kV according to patient size and/or use of iterative reconstruction technique. CONTRAST:  75mL ISOVUE-370 IOPAMIDOL (ISOVUE-370) INJECTION 76% COMPARISON:  January 22, 2021. FINDINGS: Cardiovascular: Grossly stable 4.3 cm ascending thoracic aortic aneurysm is noted. No dissection is noted. Great vessels are widely patent without significant stenosis. Normal cardiac size. No pericardial effusion. Minimal coronary artery calcifications are noted. Mediastinum/Nodes: No enlarged mediastinal, hilar, or axillary  lymph nodes. Thyroid gland, trachea, and esophagus demonstrate no significant findings. Lungs/Pleura: No pneumothorax or pleural effusion is noted. Stable 5 mm subpleural nodule seen in right middle lobe which can be considered benign at this point with no further follow-up required. No acute pulmonary abnormality seen. Upper Abdomen: No acute abnormality. Musculoskeletal: No chest wall abnormality. No acute or significant osseous findings. Review of the MIP images confirms the above findings. IMPRESSION: Grossly stable 4.3 cm ascending thoracic aortic aneurysm. Recommend annual imaging followup by CTA or MRA. This recommendation follows 2010 ACCF/AHA/AATS/ACR/ASA/SCA/SCAI/SIR/STS/SVM Guidelines for the Diagnosis and Management of Patients with Thoracic Aortic Disease. Circulation.  2010; 121: G956-O130. Aortic aneurysm NOS (ICD10-I71.9). Electronically Signed   By: Lupita Raider M.D.   On: 03/22/2022 13:57    Assessment & Plan:   Problem List Items Addressed This Visit     GERD (gastroesophageal reflux disease)    Continue on Protonix daily      HTN (hypertension) - Primary    C/o elev BP in am - SBP 150, later SBP 120-130-140 Increase Losartan to 50 mg bid if BP is high      Relevant Medications   losartan (COZAAR) 50 MG tablet   Dyslipidemia    Cont w/atorvastatin 20 mg daily      Relevant Orders   Comprehensive metabolic panel   Hemoglobin A1c   Lipid panel   Thoracic aortic aneurysm without rupture (HCC)    2023 CT: Grossly stable 4.3 cm ascending thoracic aortic aneurysm. Recommend annual imaging followup by CTA or MRA. Improve BP control F/u w/Dr Rosemary Holms      Relevant Medications   losartan (COZAAR) 50 MG tablet   Other Visit Diagnoses     Hyperglycemia       Relevant Orders   Hemoglobin A1c         Meds ordered this encounter  Medications   losartan (COZAAR) 50 MG tablet    Sig: Take 1 tablet (50 mg total) by mouth 2 (two) times daily.    Dispense:  180  tablet    Refill:  3      Follow-up: Return in about 3 months (around 01/11/2023) for a follow-up visit.  Sonda Primes, MD

## 2022-10-11 NOTE — Assessment & Plan Note (Signed)
Continue on Protonix daily

## 2022-10-11 NOTE — Patient Instructions (Signed)
Increase Losartan to 50 mg twice a day

## 2022-10-11 NOTE — Assessment & Plan Note (Addendum)
2023 CT: Grossly stable 4.3 cm ascending thoracic aortic aneurysm. Recommend annual imaging followup by CTA or MRA. Improve BP control F/u w/Dr Rosemary Holms

## 2022-10-11 NOTE — Assessment & Plan Note (Signed)
Cont w/atorvastatin 20 mg daily

## 2022-12-18 ENCOUNTER — Emergency Department (HOSPITAL_BASED_OUTPATIENT_CLINIC_OR_DEPARTMENT_OTHER): Payer: Medicare HMO

## 2022-12-18 ENCOUNTER — Emergency Department (HOSPITAL_BASED_OUTPATIENT_CLINIC_OR_DEPARTMENT_OTHER)
Admission: EM | Admit: 2022-12-18 | Discharge: 2022-12-19 | Disposition: A | Discharge status: Home / Self Care | Payer: Medicare HMO | Attending: Emergency Medicine | Admitting: Emergency Medicine

## 2022-12-18 ENCOUNTER — Other Ambulatory Visit: Payer: Self-pay

## 2022-12-18 DIAGNOSIS — M542 Cervicalgia: Secondary | ICD-10-CM | POA: Diagnosis not present

## 2022-12-18 DIAGNOSIS — Z7982 Long term (current) use of aspirin: Secondary | ICD-10-CM | POA: Diagnosis not present

## 2022-12-18 DIAGNOSIS — I10 Essential (primary) hypertension: Secondary | ICD-10-CM | POA: Insufficient documentation

## 2022-12-18 DIAGNOSIS — I2510 Atherosclerotic heart disease of native coronary artery without angina pectoris: Secondary | ICD-10-CM | POA: Diagnosis not present

## 2022-12-18 DIAGNOSIS — Z79899 Other long term (current) drug therapy: Secondary | ICD-10-CM | POA: Diagnosis not present

## 2022-12-18 DIAGNOSIS — R220 Localized swelling, mass and lump, head: Secondary | ICD-10-CM | POA: Diagnosis not present

## 2022-12-18 DIAGNOSIS — Y92410 Unspecified street and highway as the place of occurrence of the external cause: Secondary | ICD-10-CM | POA: Diagnosis not present

## 2022-12-18 DIAGNOSIS — S0003XA Contusion of scalp, initial encounter: Secondary | ICD-10-CM | POA: Diagnosis not present

## 2022-12-18 DIAGNOSIS — S0083XA Contusion of other part of head, initial encounter: Secondary | ICD-10-CM | POA: Diagnosis not present

## 2022-12-18 DIAGNOSIS — Z041 Encounter for examination and observation following transport accident: Secondary | ICD-10-CM | POA: Diagnosis not present

## 2022-12-18 DIAGNOSIS — M47812 Spondylosis without myelopathy or radiculopathy, cervical region: Secondary | ICD-10-CM | POA: Diagnosis not present

## 2022-12-18 DIAGNOSIS — S0990XA Unspecified injury of head, initial encounter: Secondary | ICD-10-CM | POA: Diagnosis present

## 2022-12-18 MED ORDER — ACETAMINOPHEN 325 MG PO TABS
650.0000 mg | ORAL_TABLET | Freq: Once | ORAL | Status: AC
  Administered 2022-12-18: 650 mg via ORAL
  Filled 2022-12-18: qty 2
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
## 2022-12-18 NOTE — ED Notes (Signed)
Pt in room with family; oriented to call light.
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
## 2022-12-19 ENCOUNTER — Encounter: Payer: Self-pay | Admitting: Internal Medicine

## 2022-12-19 NOTE — ED Notes (Signed)
RN reviewed discharge instructions with pt. Pt verbalized understanding and had no further questions 

## 2022-12-24 ENCOUNTER — Telehealth: Payer: Self-pay

## 2022-12-24 NOTE — Telephone Encounter (Signed)
Transition Care Management Unsuccessful Follow-up Telephone Call  Date of discharge and from where:  [facility] 7/14  Attempts:  1st Attempt  Reason for unsuccessful TCM follow-up call:  No answer/busy   Lenard Forth [facility] Guide, [facility] [phone] [address] [place], [place], [place] [address] Phone: [phone] Email: [email or web address]

## 2022-12-27 ENCOUNTER — Telehealth: Payer: Self-pay

## 2022-12-27 NOTE — Telephone Encounter (Signed)
Transition Care Management Unsuccessful Follow-up Telephone Call  Date of discharge and from where:  [facility] 7/14  Attempts:  2nd Attempt  Reason for unsuccessful TCM follow-up call:  No answer/busy   Lenard Forth [facility] Guide, [facility] [phone] [address] [place], [place], [place] [address] Phone: [phone] Email: [email or web address]

## 2023-01-06 ENCOUNTER — Encounter: Payer: Self-pay | Admitting: Internal Medicine

## 2023-01-06 ENCOUNTER — Ambulatory Visit (INDEPENDENT_AMBULATORY_CARE_PROVIDER_SITE_OTHER): Payer: Medicare HMO | Admitting: Internal Medicine

## 2023-01-06 VITALS — BP 110/68 | HR 84 | Temp 98.6°F | Ht 73.0 in | Wt 224.0 lb

## 2023-01-06 DIAGNOSIS — R42 Dizziness and giddiness: Secondary | ICD-10-CM | POA: Diagnosis not present

## 2023-01-06 DIAGNOSIS — N400 Enlarged prostate without lower urinary tract symptoms: Secondary | ICD-10-CM | POA: Insufficient documentation

## 2023-01-06 DIAGNOSIS — M25511 Pain in right shoulder: Secondary | ICD-10-CM | POA: Diagnosis not present

## 2023-01-06 DIAGNOSIS — M549 Dorsalgia, unspecified: Secondary | ICD-10-CM | POA: Insufficient documentation

## 2023-01-06 DIAGNOSIS — M7062 Trochanteric bursitis, left hip: Secondary | ICD-10-CM | POA: Diagnosis not present

## 2023-01-06 DIAGNOSIS — M5442 Lumbago with sciatica, left side: Secondary | ICD-10-CM

## 2023-01-06 DIAGNOSIS — S060XAA Concussion with loss of consciousness status unknown, initial encounter: Secondary | ICD-10-CM | POA: Insufficient documentation

## 2023-01-06 DIAGNOSIS — M7072 Other bursitis of hip, left hip: Secondary | ICD-10-CM | POA: Insufficient documentation

## 2023-01-06 DIAGNOSIS — S060X1D Concussion with loss of consciousness of 30 minutes or less, subsequent encounter: Secondary | ICD-10-CM | POA: Diagnosis not present

## 2023-01-06 MED ORDER — METHYLPREDNISOLONE ACETATE 40 MG/ML IJ SUSP
40.0000 mg | Freq: Once | INTRAMUSCULAR | Status: AC
Start: 2023-01-06 — End: 2023-01-06
  Administered 2023-01-06: 40 mg via INTRAMUSCULAR

## 2023-01-06 MED ORDER — LIDOCAINE HCL 2 % IJ SOLN
20.0000 mL | Freq: Once | INTRAMUSCULAR | Status: AC
Start: 2023-01-06 — End: 2023-01-06
  Administered 2023-01-06: 400 mg

## 2023-01-06 MED ORDER — MECLIZINE HCL 12.5 MG PO TABS: 12.5000 mg | ORAL_TABLET | Freq: Three times a day (TID) | ORAL | 1 refills | Status: AC | PRN

## 2023-01-06 MED ORDER — TRIAMCINOLONE ACETONIDE 0.1 % EX CREA: 1.0000 | TOPICAL_CREAM | Freq: Three times a day (TID) | CUTANEOUS | 2 refills | Status: DC
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
## 2023-01-06 NOTE — Assessment & Plan Note (Signed)
Acute on chronic.

## 2023-01-06 NOTE — Patient Instructions (Signed)
Hip opener exercises 
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
## 2023-01-20 ENCOUNTER — Encounter (INDEPENDENT_AMBULATORY_CARE_PROVIDER_SITE_OTHER): Payer: Self-pay

## 2023-01-22 ENCOUNTER — Encounter: Payer: Self-pay | Admitting: Internal Medicine
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
## 2023-01-22 NOTE — Assessment & Plan Note (Signed)
Concussion symptoms, aftereffects discussed.  Call if problems
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
## 2023-02-16 ENCOUNTER — Encounter: Payer: Self-pay | Admitting: Internal Medicine

## 2023-02-16 ENCOUNTER — Ambulatory Visit (INDEPENDENT_AMBULATORY_CARE_PROVIDER_SITE_OTHER): Payer: Medicare HMO | Admitting: Internal Medicine

## 2023-02-16 VITALS — BP 120/64 | HR 57 | Temp 97.6°F | Ht 73.0 in | Wt 227.5 lb

## 2023-02-16 DIAGNOSIS — I2510 Atherosclerotic heart disease of native coronary artery without angina pectoris: Secondary | ICD-10-CM | POA: Diagnosis not present

## 2023-02-16 DIAGNOSIS — K219 Gastro-esophageal reflux disease without esophagitis: Secondary | ICD-10-CM | POA: Diagnosis not present

## 2023-02-16 DIAGNOSIS — I10 Essential (primary) hypertension: Secondary | ICD-10-CM

## 2023-02-16 DIAGNOSIS — I200 Unstable angina: Secondary | ICD-10-CM

## 2023-02-16 DIAGNOSIS — M5442 Lumbago with sciatica, left side: Secondary | ICD-10-CM | POA: Diagnosis not present

## 2023-02-16 DIAGNOSIS — E785 Hyperlipidemia, unspecified: Secondary | ICD-10-CM | POA: Diagnosis not present

## 2023-02-16 MED ORDER — PITAVASTATIN MAGNESIUM 1 MG PO TABS: 1.0000 mg | ORAL_TABLET | Freq: Every day | ORAL | 3 refills | Status: DC

## 2023-02-16 MED ORDER — PANTOPRAZOLE SODIUM 40 MG PO TBEC: 40.0000 mg | DELAYED_RELEASE_TABLET | Freq: Every day | ORAL | 3 refills | Status: DC

## 2023-02-16 MED ORDER — METOPROLOL SUCCINATE ER 50 MG PO TB24: ORAL_TABLET | ORAL | 3 refills | Status: DC

## 2023-02-16 MED ORDER — LOSARTAN POTASSIUM 50 MG PO TABS: 50.0000 mg | ORAL_TABLET | Freq: Two times a day (BID) | ORAL | 3 refills | Status: DC

## 2023-02-16 MED ORDER — FAMOTIDINE 40 MG PO TABS: 40.0000 mg | ORAL_TABLET | Freq: Every day | ORAL | 3 refills | Status: AC

## 2023-02-16 NOTE — Assessment & Plan Note (Signed)
Better w/PT 

## 2023-02-16 NOTE — Assessment & Plan Note (Signed)
Losartan 50 mg bid.

## 2023-02-16 NOTE — Assessment & Plan Note (Signed)
Continue on Protonix daily

## 2023-02-16 NOTE — Assessment & Plan Note (Signed)
D/c Lipitor - pains Start Pitavastatin
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
## 2023-02-16 NOTE — Assessment & Plan Note (Signed)
Better after PT

## 2023-02-16 NOTE — Addendum Note (Signed)
Addended by: Sumner Boast on: 02/16/2023 02:46 PM   Modules accepted: Orders

## 2023-02-17 ENCOUNTER — Telehealth: Payer: Self-pay | Admitting: Internal Medicine

## 2023-02-17 NOTE — Telephone Encounter (Signed)
Pharmacy states that they can not get Pitavastatin Magnesium 1 MG TABS anymore because the manufacturer stopped making it. They now have Pitavastatin Calcium which is what they will give the pt.

## 2023-02-19 NOTE — Telephone Encounter (Signed)
Noted. Agree. Thank you.

## 2023-03-09 ENCOUNTER — Telehealth: Payer: Self-pay | Admitting: Pharmacy Technician

## 2023-03-09 ENCOUNTER — Other Ambulatory Visit (HOSPITAL_COMMUNITY): Payer: Self-pay

## 2023-03-09 NOTE — Telephone Encounter (Signed)
Pharmacy Patient Advocate Encounter   Received notification from CoverMyMeds that prior authorization for Pitavastatin Calcium 1MG  tablets is required/requested.   Insurance verification completed.   The patient is insured through [employer] .   Per test claim:

## 2023-03-10 MED ORDER — PITAVASTATIN MAGNESIUM 2 MG PO TABS: 2.0000 mg | ORAL_TABLET | Freq: Every day | ORAL | 1 refills | Status: DC

## 2023-03-10 NOTE — Telephone Encounter (Signed)
OK Zypatamag 2 mg if covered.  Thanks

## 2023-03-14 MED ORDER — PITAVASTATIN MAGNESIUM 2 MG PO TABS: 2.0000 mg | ORAL_TABLET | Freq: Every day | ORAL | 1 refills | Status: DC

## 2023-03-14 NOTE — Telephone Encounter (Signed)
Okay.  Thanks.

## 2023-03-15 ENCOUNTER — Telehealth: Payer: Self-pay | Admitting: Internal Medicine

## 2023-03-15 NOTE — Telephone Encounter (Signed)
Dr. Macario Golds the Pitavastatin Magnesium is $135 for a 90 day supply. But the Pitavastatin Calcium (Livalo) does come in a generic form and a little less costly. If the patient is okay with paying the $135 [facility] is okay with filling it, if the patient doesn't want to pay that much may you change the script ?

## 2023-03-15 NOTE — Telephone Encounter (Signed)
Unable to reach pharmacy. Will call at 10

## 2023-03-15 NOTE — Telephone Encounter (Signed)
[facility] pharmacy called and wanted clarification on the prescription for pitavastatin. They said the prescription is for pitavastatin magnesium, which does not have a generic/cheaper form. They wanted to make sure it was not supposed to be pitavastatin calcium. They would like a call back at [phone].

## 2023-03-16 NOTE — Telephone Encounter (Signed)
Okay to change to generic Livalo if it is less costly.  Thank you

## 2023-03-17 ENCOUNTER — Other Ambulatory Visit: Payer: Self-pay

## 2023-03-17 NOTE — Telephone Encounter (Signed)
Medication has been changed. Chart has been updated to reflect the change.

## 2023-03-18 ENCOUNTER — Ambulatory Visit: Payer: Medicare HMO | Admitting: Internal Medicine

## 2023-03-18 ENCOUNTER — Encounter: Payer: Self-pay | Admitting: Internal Medicine

## 2023-03-18 DIAGNOSIS — S060X1D Concussion with loss of consciousness of 30 minutes or less, subsequent encounter: Secondary | ICD-10-CM | POA: Diagnosis not present

## 2023-03-18 DIAGNOSIS — I2510 Atherosclerotic heart disease of native coronary artery without angina pectoris: Secondary | ICD-10-CM | POA: Diagnosis not present

## 2023-03-18 DIAGNOSIS — M5442 Lumbago with sciatica, left side: Secondary | ICD-10-CM

## 2023-03-18 NOTE — Assessment & Plan Note (Signed)
Not better Tylenol prn Start Physical therapy at [facility]

## 2023-03-18 NOTE — Assessment & Plan Note (Signed)
On the statin, ASA No CP

## 2023-03-18 NOTE — Assessment & Plan Note (Signed)
HAs - not better Tylenol prn Start Physical therapy at [facility]

## 2023-03-18 NOTE — Assessment & Plan Note (Addendum)
HAs, LBP- not better Tylenol prn Start Physical therapy at [facility]
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
## 2023-03-29 ENCOUNTER — Ambulatory Visit: Payer: Medicare HMO | Admitting: Internal Medicine

## 2023-03-31 ENCOUNTER — Other Ambulatory Visit (HOSPITAL_COMMUNITY): Payer: Self-pay

## 2023-04-21 ENCOUNTER — Ambulatory Visit: Payer: Medicare HMO | Admitting: Internal Medicine

## 2023-04-21 ENCOUNTER — Encounter: Payer: Self-pay | Admitting: Internal Medicine

## 2023-04-21 VITALS — BP 110/68 | HR 65 | Temp 98.2°F | Ht 73.0 in | Wt 232.0 lb

## 2023-04-21 DIAGNOSIS — S060X1D Concussion with loss of consciousness of 30 minutes or less, subsequent encounter: Secondary | ICD-10-CM | POA: Diagnosis not present

## 2023-04-21 DIAGNOSIS — R42 Dizziness and giddiness: Secondary | ICD-10-CM

## 2023-04-21 DIAGNOSIS — I10 Essential (primary) hypertension: Secondary | ICD-10-CM | POA: Diagnosis not present

## 2023-04-21 NOTE — Assessment & Plan Note (Signed)
Benign Positional Vertigo symptoms on the left. On Meclizine. Start [employer] Piccol[employer] exercise several times a day as dirrected.

## 2023-04-21 NOTE — Assessment & Plan Note (Signed)
HAs, LBP- better
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
## 2023-04-21 NOTE — Assessment & Plan Note (Signed)
Losartan 50 mg bid.

## 2023-04-21 NOTE — Assessment & Plan Note (Signed)
HAs - better Tylenol prn S/p Physical therapy at [facility]

## 2023-05-19 ENCOUNTER — Ambulatory Visit: Payer: Medicare HMO | Admitting: Internal Medicine

## 2023-06-15 DIAGNOSIS — H532 Diplopia: Secondary | ICD-10-CM | POA: Diagnosis not present

## 2023-06-15 DIAGNOSIS — H524 Presbyopia: Secondary | ICD-10-CM | POA: Diagnosis not present

## 2023-06-15 DIAGNOSIS — H52223 Regular astigmatism, bilateral: Secondary | ICD-10-CM | POA: Diagnosis not present

## 2023-06-15 DIAGNOSIS — H5330 Unspecified disorder of binocular vision: Secondary | ICD-10-CM | POA: Diagnosis not present

## 2023-06-15 DIAGNOSIS — H04123 Dry eye syndrome of bilateral lacrimal glands: Secondary | ICD-10-CM | POA: Diagnosis not present

## 2023-06-15 DIAGNOSIS — H35372 Puckering of macula, left eye: Secondary | ICD-10-CM | POA: Diagnosis not present

## 2023-06-15 DIAGNOSIS — H2513 Age-related nuclear cataract, bilateral: Secondary | ICD-10-CM | POA: Diagnosis not present

## 2023-06-15 DIAGNOSIS — H5211 Myopia, right eye: Secondary | ICD-10-CM | POA: Diagnosis not present

## 2023-06-15 DIAGNOSIS — H5202 Hypermetropia, left eye: Secondary | ICD-10-CM | POA: Diagnosis not present

## 2023-06-22 ENCOUNTER — Encounter: Payer: Self-pay | Admitting: Internal Medicine

## 2023-06-22 ENCOUNTER — Ambulatory Visit: Payer: Medicare HMO | Admitting: Internal Medicine

## 2023-06-22 DIAGNOSIS — I2510 Atherosclerotic heart disease of native coronary artery without angina pectoris: Secondary | ICD-10-CM

## 2023-06-22 DIAGNOSIS — S060X1D Concussion with loss of consciousness of 30 minutes or less, subsequent encounter: Secondary | ICD-10-CM | POA: Diagnosis not present

## 2023-06-22 DIAGNOSIS — M5442 Lumbago with sciatica, left side: Secondary | ICD-10-CM

## 2023-06-22 DIAGNOSIS — R42 Dizziness and giddiness: Secondary | ICD-10-CM

## 2023-06-22 DIAGNOSIS — I2583 Coronary atherosclerosis due to lipid rich plaque: Secondary | ICD-10-CM | POA: Diagnosis not present

## 2023-06-22 DIAGNOSIS — I7121 Aneurysm of the ascending aorta, without rupture: Secondary | ICD-10-CM

## 2023-06-22 NOTE — Assessment & Plan Note (Signed)
Not better Tylenol prn Start Physical therapy at [facility]

## 2023-06-22 NOTE — Assessment & Plan Note (Signed)
2023 CT: Grossly stable 4.3 cm ascending thoracic aortic aneurysm. Recommend annual imaging followup by CTA or MRA. Improve BP control F/u w/Dr Rosemary Holms

## 2023-06-22 NOTE — Assessment & Plan Note (Signed)
 Benign Positional Vertigo symptoms on the left -- resolved

## 2023-06-22 NOTE — Assessment & Plan Note (Signed)
On the statin, ASA No CP

## 2023-06-22 NOTE — Assessment & Plan Note (Signed)
 HAs, LBP- resolved

## 2023-06-22 NOTE — Assessment & Plan Note (Signed)
 HAs, dizziness - resolved Tylenol  prn S/p Physical therapy at [facility] - completed
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
## 2023-06-23 ENCOUNTER — Other Ambulatory Visit: Payer: Self-pay | Admitting: Internal Medicine

## 2023-08-20 ENCOUNTER — Other Ambulatory Visit: Payer: Self-pay | Admitting: Internal Medicine

## 2023-09-06 ENCOUNTER — Ambulatory Visit (INDEPENDENT_AMBULATORY_CARE_PROVIDER_SITE_OTHER): Admitting: Internal Medicine

## 2023-09-06 ENCOUNTER — Encounter: Payer: Self-pay | Admitting: Internal Medicine

## 2023-09-06 VITALS — BP 140/80 | HR 57 | Temp 98.2°F | Ht 73.0 in | Wt 235.0 lb

## 2023-09-06 DIAGNOSIS — E785 Hyperlipidemia, unspecified: Secondary | ICD-10-CM | POA: Diagnosis not present

## 2023-09-06 DIAGNOSIS — I2510 Atherosclerotic heart disease of native coronary artery without angina pectoris: Secondary | ICD-10-CM | POA: Diagnosis not present

## 2023-09-06 DIAGNOSIS — I10 Essential (primary) hypertension: Secondary | ICD-10-CM

## 2023-09-06 DIAGNOSIS — I7121 Aneurysm of the ascending aorta, without rupture: Secondary | ICD-10-CM

## 2023-09-06 DIAGNOSIS — I2583 Coronary atherosclerosis due to lipid rich plaque: Secondary | ICD-10-CM

## 2023-09-06 LAB — COMPREHENSIVE METABOLIC PANEL WITH GFR
ALT: 25 U/L (ref 0–53)
AST: 22 U/L (ref 0–37)
Albumin: 4.4 g/dL (ref 3.5–5.2)
Alkaline Phosphatase: 90 U/L (ref 39–117)
BUN: 22 mg/dL (ref 6–23)
CO2: 29 meq/L (ref 19–32)
Calcium: 9.3 mg/dL (ref 8.4–10.5)
Chloride: 103 meq/L (ref 96–112)
Creatinine, Ser: 1.19 mg/dL (ref 0.40–1.50)
GFR: 60.23 mL/min (ref >60.00)
Glucose, Bld: 92 mg/dL (ref 70–99)
Potassium: 4.1 meq/L (ref 3.5–5.1)
Sodium: 139 meq/L (ref 135–145)
Total Bilirubin: 1.6 mg/dL — ABNORMAL HIGH (ref 0.2–1.2)
Total Protein: 7.2 g/dL (ref 6.0–8.3)

## 2023-09-06 LAB — LIPID PANEL
Cholesterol: 136 mg/dL (ref 0–200)
HDL: 43.7 mg/dL (ref >39.00)
LDL Cholesterol: 74 mg/dL (ref 0–99)
NonHDL: 92.68
Total CHOL/HDL Ratio: 3
Triglycerides: 93 mg/dL (ref 0.0–149.0)
VLDL: 18.6 mg/dL (ref 0.0–40.0)

## 2023-09-06 NOTE — Assessment & Plan Note (Signed)
  On Pitavastatin

## 2023-09-06 NOTE — Assessment & Plan Note (Addendum)
 On Pitavastatin, ASA F/u w/Cardiology - F/u w/Dr Rosemary Holms
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
## 2023-09-06 NOTE — Assessment & Plan Note (Addendum)
 BP is nl at home On Losartan 50 mg bid, Toprol

## 2023-09-06 NOTE — Assessment & Plan Note (Addendum)
 F/u w/Cardiology - w/Dr Rosemary Holms

## 2023-09-20 ENCOUNTER — Ambulatory Visit: Payer: Medicare HMO | Admitting: Internal Medicine

## 2023-09-29 ENCOUNTER — Other Ambulatory Visit: Payer: Self-pay | Admitting: Internal Medicine

## 2023-12-05 ENCOUNTER — Ambulatory Visit: Attending: Cardiology | Admitting: Cardiology

## 2023-12-05 ENCOUNTER — Encounter: Payer: Self-pay | Admitting: Cardiology

## 2023-12-05 VITALS — BP 138/70 | HR 57 | Ht 73.0 in | Wt 231.0 lb

## 2023-12-05 DIAGNOSIS — I7121 Aneurysm of the ascending aorta, without rupture: Secondary | ICD-10-CM

## 2023-12-05 DIAGNOSIS — E782 Mixed hyperlipidemia: Secondary | ICD-10-CM | POA: Diagnosis not present

## 2023-12-05 DIAGNOSIS — I2510 Atherosclerotic heart disease of native coronary artery without angina pectoris: Secondary | ICD-10-CM

## 2023-12-05 DIAGNOSIS — I10 Essential (primary) hypertension: Secondary | ICD-10-CM

## 2023-12-05 MED ORDER — EZETIMIBE 10 MG PO TABS: 10.0000 mg | ORAL_TABLET | Freq: Every day | ORAL | 3 refills | Status: AC

## 2023-12-05 MED ORDER — ATORVASTATIN CALCIUM 80 MG PO TABS: 80.0000 mg | ORAL_TABLET | Freq: Every day | ORAL | 3 refills | Status: AC

## 2023-12-05 NOTE — Progress Notes (Signed)
 Cardiology Office Note:  .   Date:  12/05/2023  ID:  Alexander Hughes, DOB 1951/03/30, MRN [id number] PCP: Garald Karlynn GAILS, MD  [facility] Providers Cardiologist:  Newman Lawrence, MD PCP: Garald Karlynn GAILS, MD  Chief Complaint  Patient presents with   Coronary Artery Disease     Alexander Hughes is a 73 y.o. male with hypertension, CAD, TAA 4.3 cm.   History of Present Illness  Alexander Hughes interpreter assisted with today's visit.  Patient is doing well, denies chest pain, shortness of breath, palpitations, leg edema, orthopnea, PND, TIA/syncope.  His blood pressure at home ranges anywhere from 110/70 to 140/70 mmHg.  He walks some, and plays racquetball at the gym regularly without any complaints.  He sees his PCP regularly for follow-up.  Reviewed recent lab results with the patient, details below.      Vitals:   12/05/23 0812  BP: 138/70  Pulse: (!) 57  SpO2: 95%      Review of Systems  Cardiovascular:  Negative for chest pain, dyspnea on exertion, leg swelling, palpitations and syncope.        Studies Reviewed: SABRA        EKG 12/05/2023: Sinus rhythm  with Premature supraventricular complexes When compared with ECG of 25-Dec-2020 08:33, Premature supraventricular complexes are now Present   Labs 09/2023: Chol 136, TG 93, HDL 43, LDL 74 HbA1C 6.0% Hb 13.9 Cr 1.19, eGFR 60. T.nili 1.6 TSH 1.3  CTA aorta 03/2022: Grossly stable 4.3 cm ascending thoracic aortic aneurysm. Recommend annual imaging followup by CTA or MRA. This recommendation follows 2010 ACCF/AHA/AATS/ACR/ASA/SCA/SCAI/SIR/STS/SVM Guidelines for the Diagnosis and Management of Patients with Thoracic Aortic Disease. Circulation. 2010; 121: Z733-z630. Aortic aneurysm NOS (ICD10-I71.9).  Echocardiogram 2022: Left ventricle cavity is normal in size. Mild concentric hypertrophy of the left ventricle. Normal global wall motion. Normal LV systolic function with EF 55%.  Doppler evidence of grade I (impaired) diastolic dysfunction, normal LAP. The aortic root is dilated. Max dimension at prox ascending aorta measures 4.4 cm. Left atrial cavity is mildly dilated. Aneurysmal interatrial septum without evidence of interatrial shunting. Trileaflet aortic valve.  Mild (Grade I) aortic regurgitation. Moderate (Grade II) mitral regurgitation. Mild tricuspid regurgitation. No evidence of pulmonary hypertension. No significant change compared to previous study on 08/25/2018.   Coronary angiography and intervention 2020: LM: Normal LAD: Ostial-prox LAD 30% stenosis        Prox LAD 50% stenosis        Diag 1 ostial 30% stenosis LCx: Dominant vessel with prox-mid 20% stenosis        Large OM1 with 100% mid vessel occlusion. 50% ostial OM1 stenosis        Successful percutaneous coronary intervention         Aspiration thrombectomy, PTCA and stent placement        Synergy 3.0 X 20 mm drug-eluting stent mid OM1 RCA: Nondomiant vessel. No significant disease   Physical Exam Vitals and nursing note reviewed.  Constitutional:      General: He is not in acute distress. Neck:     Vascular: No JVD.   Cardiovascular:     Rate and Rhythm: Normal rate and regular rhythm.     Heart sounds: Normal heart sounds. No murmur heard. Pulmonary:     Effort: Pulmonary effort is normal.     Breath sounds: Normal breath sounds. No wheezing or rales.   Musculoskeletal:     Right lower leg: No edema.     Left  lower leg: No edema.      VISIT DIAGNOSES:   ICD-10-CM   1. Coronary artery disease involving native coronary artery of native heart without angina pectoris  I25.10 EKG 12-Lead    2. Aneurysm of ascending aorta without rupture (HCC)  I71.21 ECHOCARDIOGRAM COMPLETE    Basic metabolic panel with GFR    CT ANGIO CHEST AORTA W/ & OR WO/CM & GATING (HEART & VASCULAR TOWER ONLY)    ECHOCARDIOGRAM COMPLETE    Basic metabolic panel with GFR    3. Mixed hyperlipidemia   E78.2     4. Primary hypertension  I10        Alexander Hughes is a 73 y.o. male with hypertension, CAD, TAA 4.3 cm.  Assessment & Plan  Coronary artery disease without angina: Non-STEMI 08/2018.  Successful aspiration thrombectomy and stent placement to large OM1 100% occlusion.  Synergy 3.0 x 20 mm DES. Mild to moderate residual coronary artery disease. No angina symptoms at this time. Continue Aspirin  81 mg daily, metoprolol  succinate 50 mg daily. LDL 74 on atorvastatin  40 mg daily, and pravastatin. Recommend increasing atorvastatin  to 80 mg daily and adding Zetia 10 mg daily for goal LDL <70.  Continue follow-up lipid panel with PCP.   Dilated ascending aorta/TAA: Comparable findings on CT and aorta in the past. Check echocardiogram and CTA aorta now, then check echocardiogram in 1 year, and CTA in 2 years from now. Avoid weight lifting. Blood pressure and heart rate control with losartan  and metoprolol .    Hypertension: Variable blood pressure readings ranging from 110/70 to 140/80 No change made today, but recommend regular blood pressure monitoring and take the log to PCP visits regularly.  If blood pressure stays >130/80 mmHg consistently, consider adding amlodipine  or increasing dose of losartan .    Meds ordered this encounter  Medications   ezetimibe (ZETIA) 10 MG tablet    Sig: Take 1 tablet (10 mg total) by mouth daily.    Dispense:  90 tablet    Refill:  3   atorvastatin  (LIPITOR ) 80 MG tablet    Sig: Take 1 tablet (80 mg total) by mouth daily.    Dispense:  90 tablet    Refill:  3    Dose increase     F/u in 1 year  Signed, Newman JINNY Lawrence, MD

## 2023-12-05 NOTE — Patient Instructions (Addendum)
 Medication Instructions:  INCREASE Lipitor  to 80 mg daily STOP Pitavastatin  Calcium  START Zetia 10 mg daily *If you need a refill on your cardiac medications before your next appointment, please call your pharmacy*  Lab Work: BMP If you have labs (blood work) drawn today and your tests are completely normal, you will receive your results only by: MyChart Message (if you have MyChart) OR A paper copy in the mail If you have any lab test that is abnormal or we need to change your treatment, we will call you to review the results.  Testing/Procedures: ECHO needed and in one year Your physician has requested that you have an echocardiogram. Echocardiography is a painless test that uses sound waves to create images of your heart. It provides your doctor with information about the size and shape of your heart and how well your heart's chambers and valves are working. This procedure takes approximately one hour. There are no restrictions for this procedure. Please do NOT wear cologne, perfume, aftershave, or lotions (deodorant is allowed). Please arrive 15 minutes prior to your appointment time.  Please note: We ask at that you not bring children with you during ultrasound (echo/ vascular) testing. Due to room size and safety concerns, children are not allowed in the ultrasound rooms during exams. Our front office staff cannot provide observation of children in our lobby area while testing is being conducted. An adult accompanying a patient to their appointment will only be allowed in the ultrasound room at the discretion of the ultrasound technician under special circumstances. We apologize for any inconvenience.   CTA Aorta  Non-Cardiac CT Angiography (CTA), is a special type of CT scan that uses a computer to produce multi-dimensional views of major blood vessels throughout the body. In CT angiography, a contrast material is injected through an IV to help visualize the blood vessels    Follow-Up: At [facility], you and your health needs are our priority.  As part of our continuing mission to provide you with exceptional heart care, our providers are all part of one team.  This team includes your primary Cardiologist (physician) and Advanced Practice Providers or APPs (Physician Assistants and Nurse Practitioners) who all work together to provide you with the care you need, when you need it.  Your next appointment:   1 year  Provider:   Newman JINNY Lawrence, MD

## 2023-12-06 ENCOUNTER — Encounter: Payer: Self-pay | Admitting: Internal Medicine

## 2023-12-06 ENCOUNTER — Ambulatory Visit (INDEPENDENT_AMBULATORY_CARE_PROVIDER_SITE_OTHER): Admitting: Internal Medicine

## 2023-12-06 VITALS — BP 110/68 | HR 52 | Temp 97.9°F | Ht 73.0 in | Wt 231.0 lb

## 2023-12-06 DIAGNOSIS — E782 Mixed hyperlipidemia: Secondary | ICD-10-CM | POA: Diagnosis not present

## 2023-12-06 DIAGNOSIS — M5442 Lumbago with sciatica, left side: Secondary | ICD-10-CM

## 2023-12-06 DIAGNOSIS — I10 Essential (primary) hypertension: Secondary | ICD-10-CM | POA: Diagnosis not present

## 2023-12-06 DIAGNOSIS — K219 Gastro-esophageal reflux disease without esophagitis: Secondary | ICD-10-CM

## 2023-12-06 DIAGNOSIS — I2583 Coronary atherosclerosis due to lipid rich plaque: Secondary | ICD-10-CM

## 2023-12-06 DIAGNOSIS — I2510 Atherosclerotic heart disease of native coronary artery without angina pectoris: Secondary | ICD-10-CM | POA: Diagnosis not present

## 2023-12-06 DIAGNOSIS — I7121 Aneurysm of the ascending aorta, without rupture: Secondary | ICD-10-CM | POA: Diagnosis not present

## 2023-12-06 NOTE — Assessment & Plan Note (Signed)
 BP is nl at home On Losartan 50 mg bid, Toprol

## 2023-12-06 NOTE — Assessment & Plan Note (Addendum)
 MSK. Will watch. No LBP now
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
## 2023-12-06 NOTE — Assessment & Plan Note (Addendum)
 On Pitavastatin , ASA F/u w/Cardiology - F/u w/Dr Patwardhan ECHO, CTA pending Dr Patwardan d/c'd Pitavastatin , started on Lipitor , Zetia

## 2023-12-06 NOTE — Assessment & Plan Note (Signed)
 CTA pending.

## 2023-12-06 NOTE — Assessment & Plan Note (Signed)
 Dr Elmira d/c'd Pitavastatin , started on Lipitor /Zetia 11/2023

## 2023-12-06 NOTE — Assessment & Plan Note (Signed)
Continue on Protonix daily

## 2023-12-29 DIAGNOSIS — I7121 Aneurysm of the ascending aorta, without rupture: Secondary | ICD-10-CM | POA: Diagnosis not present

## 2023-12-29 LAB — BASIC METABOLIC PANEL WITH GFR
BUN/Creatinine Ratio: 25 — ABNORMAL HIGH (ref 10–24)
BUN: 33 mg/dL — ABNORMAL HIGH (ref 8–27)
CO2: 20 mmol/L (ref 20–29)
Calcium: 9.5 mg/dL (ref 8.6–10.2)
Chloride: 105 mmol/L (ref 96–106)
Creatinine, Ser: 1.34 mg/dL — ABNORMAL HIGH (ref 0.76–1.27)
Glucose: 90 mg/dL (ref 70–99)
Potassium: 4.2 mmol/L (ref 3.5–5.2)
Sodium: 141 mmol/L (ref 134–144)
eGFR: 56 mL/min/1.73 — ABNORMAL LOW (ref >59)

## 2023-12-30 ENCOUNTER — Ambulatory Visit (HOSPITAL_COMMUNITY)
Admission: RE | Admit: 2023-12-30 | Discharge: 2023-12-30 | Disposition: A | Discharge status: Home / Self Care | Source: Ambulatory Visit | Attending: Cardiology | Admitting: Cardiology

## 2023-12-30 DIAGNOSIS — I7121 Aneurysm of the ascending aorta, without rupture: Secondary | ICD-10-CM | POA: Insufficient documentation

## 2023-12-30 DIAGNOSIS — I700 Atherosclerosis of aorta: Secondary | ICD-10-CM | POA: Diagnosis not present

## 2023-12-30 DIAGNOSIS — R911 Solitary pulmonary nodule: Secondary | ICD-10-CM | POA: Diagnosis not present

## 2023-12-30 MED ORDER — IOHEXOL 350 MG/ML SOLN
75.0000 mL | Freq: Once | INTRAVENOUS | Status: AC | PRN
  Administered 2023-12-30: 75 mL via INTRAVENOUS

## 2023-12-31 ENCOUNTER — Ambulatory Visit: Payer: Self-pay | Admitting: Cardiology

## 2023-12-31 DIAGNOSIS — I7121 Aneurysm of the ascending aorta, without rupture: Secondary | ICD-10-CM

## 2023-12-31 NOTE — Progress Notes (Signed)
(  [place] speaking patient): Cr slightly higher than before. Increase water  intake, especially with upcoming CTA aorta.  Thanks MJP

## 2024-01-02 ENCOUNTER — Encounter: Payer: Self-pay | Admitting: Internal Medicine

## 2024-01-06 NOTE — Progress Notes (Signed)
 Stable dilation of ascending aorta. Continue current medications. Check echocardiogram in 1 year (ascending aorta aneurysm).  Thanks MJP

## 2024-01-09 ENCOUNTER — Ambulatory Visit (INDEPENDENT_AMBULATORY_CARE_PROVIDER_SITE_OTHER): Admitting: Internal Medicine

## 2024-01-09 ENCOUNTER — Encounter: Payer: Self-pay | Admitting: Internal Medicine

## 2024-01-09 VITALS — BP 140/80 | HR 51 | Temp 97.7°F | Ht 73.0 in | Wt 230.0 lb

## 2024-01-09 DIAGNOSIS — R519 Headache, unspecified: Secondary | ICD-10-CM | POA: Diagnosis not present

## 2024-01-09 DIAGNOSIS — I10 Essential (primary) hypertension: Secondary | ICD-10-CM | POA: Diagnosis not present

## 2024-01-09 DIAGNOSIS — E785 Hyperlipidemia, unspecified: Secondary | ICD-10-CM

## 2024-01-09 DIAGNOSIS — I2583 Coronary atherosclerosis due to lipid rich plaque: Secondary | ICD-10-CM | POA: Diagnosis not present

## 2024-01-09 DIAGNOSIS — I2510 Atherosclerotic heart disease of native coronary artery without angina pectoris: Secondary | ICD-10-CM

## 2024-01-09 MED ORDER — OLMESARTAN MEDOXOMIL 40 MG PO TABS: 40.0000 mg | ORAL_TABLET | Freq: Every day | ORAL | 3 refills | Status: AC

## 2024-01-09 NOTE — Assessment & Plan Note (Signed)
 On Pitavastatin , ASA F/u w/Cardiology - F/u w/Dr Patwardhan ECHO, CTA pending Dr Patwardan d/c'd Pitavastatin , started on Lipitor , Zetia

## 2024-01-09 NOTE — Progress Notes (Signed)
 Subjective:  Patient ID: Alexander Hughes, male    DOB: 12-27-1950  Age: 73 y.o. MRN: [id number]  CC: Medical Management of Chronic Issues (Pt was seen at Cardiology and was informed he has High BP... Pt was put on medications to treat this.)   HPI [name] presents for HTN - SBP 170-190 x1.5 wks. C/o HAs x1.5 wks. HA is 5/10 - dull, occipital HA Takes Losartan  50 every day (not bid) - he forgets the night dose...  Outpatient Medications Prior to Visit  Medication Sig Dispense Refill   aspirin  EC 81 MG tablet Take 81 mg by mouth daily. Swallow whole.     atorvastatin  (LIPITOR ) 80 MG tablet Take 1 tablet (80 mg total) by mouth daily. 90 tablet 3   Cholecalciferol (DIALYVITE VITAMIN D  5000) 125 MCG (5000 UT) capsule Take 5,000 Units by mouth daily.     ezetimibe  (ZETIA ) 10 MG tablet Take 1 tablet (10 mg total) by mouth daily. 90 tablet 3   famotidine  (PEPCID ) 40 MG tablet Take 1 tablet (40 mg total) by mouth at bedtime. 90 tablet 3   metoprolol  succinate (TOPROL -XL) 50 MG 24 hr tablet TAKE ONE TABLET BY MOUTH ONE TIME DAILY WITH OR IMMEDIATELY FOLLOWING A MEAL 90 tablet 3   nitroGLYCERIN  (NITROSTAT ) 0.4 MG SL tablet Place 1 tablet (0.4 mg total) under the tongue every 5 (five) minutes as needed for chest pain. 20 tablet 3   pantoprazole  (PROTONIX ) 40 MG tablet Take 1 tablet (40 mg total) by mouth daily. Must keep appt for future refills 90 tablet 3   polyethylene glycol (MIRALAX  / GLYCOLAX ) 17 g packet Take 17 g by mouth daily as needed for mild constipation. 14 each 0   triamcinolone  cream (KENALOG ) 0.1 % APPLY ONE APPLICATION TOPICALLY THREE TIMES DAILY 80 g 0   losartan  (COZAAR ) 50 MG tablet Take 1 tablet (50 mg total) by mouth 2 (two) times daily. 180 tablet 3   No facility-administered medications prior to visit.    ROS: Review of Systems  Constitutional:  Negative for appetite change, fatigue and unexpected weight change.  HENT:  Negative for congestion, nosebleeds,  sneezing, sore throat and trouble swallowing.   Eyes:  Negative for itching and visual disturbance.  Respiratory:  Negative for cough.   Cardiovascular:  Negative for chest pain, palpitations and leg swelling.  Gastrointestinal:  Negative for abdominal distention, blood in stool, diarrhea and nausea.  Genitourinary:  Negative for frequency and hematuria.  Musculoskeletal:  Negative for back pain, gait problem, joint swelling and neck pain.  Skin:  Negative for rash.  Neurological:  Positive for headaches. Negative for dizziness, tremors, speech difficulty and weakness.  Psychiatric/Behavioral:  Negative for agitation, dysphoric mood and sleep disturbance. The patient is not nervous/anxious.     Objective:  BP (!) 140/80   Pulse (!) 51   Temp 97.7 F (36.5 C) (Oral)   Ht 6' 1 (1.854 m)   Wt 230 lb (104.3 kg)   SpO2 97%   BMI 30.34 kg/m   BP Readings from Last 3 Encounters:  01/09/24 (!) 140/80  12/06/23 110/68  12/05/23 138/70    Wt Readings from Last 3 Encounters:  01/09/24 230 lb (104.3 kg)  12/06/23 231 lb (104.8 kg)  12/05/23 231 lb (104.8 kg)    Physical Exam Constitutional:      General: He is not in acute distress.    Appearance: Normal appearance. He is well-developed.     Comments: NAD  Eyes:  Conjunctiva/sclera: Conjunctivae normal.     Pupils: Pupils are equal, round, and reactive to light.  Neck:     Thyroid : No thyromegaly.     Vascular: No JVD.  Cardiovascular:     Rate and Rhythm: Normal rate and regular rhythm.     Heart sounds: Normal heart sounds. No murmur heard.    No friction rub. No gallop.  Pulmonary:     Effort: Pulmonary effort is normal. No respiratory distress.     Breath sounds: Normal breath sounds. No wheezing or rales.  Chest:     Chest wall: No tenderness.  Abdominal:     General: Bowel sounds are normal. There is no distension.     Palpations: Abdomen is soft. There is no mass.     Tenderness: There is no abdominal  tenderness. There is no guarding or rebound.  Musculoskeletal:        General: No tenderness. Normal range of motion.     Cervical back: Normal range of motion.     Right lower leg: No edema.     Left lower leg: No edema.  Lymphadenopathy:     Cervical: No cervical adenopathy.  Skin:    General: Skin is warm and dry.     Findings: No rash.  Neurological:     Mental Status: He is alert and oriented to person, place, and time.     Cranial Nerves: No cranial nerve deficit.     Motor: No abnormal muscle tone.     Coordination: Coordination normal.     Gait: Gait normal.     Deep Tendon Reflexes: Reflexes are normal and symmetric.  Psychiatric:        Behavior: Behavior normal.        Thought Content: Thought content normal.        Judgment: Judgment normal.   Neuro - nonfocal A little ataxic  Lab Results  Component Value Date   WBC 6.5 04/01/2022   HGB 13.9 04/01/2022   HCT 41.6 04/01/2022   PLT 176.0 04/01/2022   GLUCOSE 90 12/29/2023   CHOL 136 09/06/2023   TRIG 93.0 09/06/2023   HDL 43.70 09/06/2023   LDLCALC 74 09/06/2023   ALT 25 09/06/2023   AST 22 09/06/2023   NA 141 12/29/2023   K 4.2 12/29/2023   CL 105 12/29/2023   CREATININE 1.34 (H) 12/29/2023   BUN 33 (H) 12/29/2023   CO2 20 12/29/2023   TSH 1.35 04/01/2022   PSA 3.85 04/01/2022   INR 1.0 08/25/2018   HGBA1C 6.0 10/11/2022    CT ANGIO CHEST AORTA W/ & OR WO/CM & GATING (HEART & VASCULAR TOWER ONLY) Result Date: 01/03/2024 CLINICAL DATA:  Thoracic aortic aneurysm. EXAM: CT ANGIOGRAPHY CHEST WITH CONTRAST TECHNIQUE: Multidetector CT imaging of the chest was performed using the standard protocol during bolus administration of intravenous contrast. Multiplanar CT image reconstructions and MIPs were obtained to evaluate the vascular anatomy. RADIATION DOSE REDUCTION: This exam was performed according to the departmental dose-optimization program which includes automated exposure control, adjustment of the mA  and/or kV according to patient size and/or use of iterative reconstruction technique. CONTRAST:  75mL OMNIPAQUE  IOHEXOL  350 MG/ML SOLN COMPARISON:  Chest CT dated 03/22/2022. FINDINGS: Cardiovascular: There is no cardiomegaly or pericardial effusion. There is coronary vascular calcification of the LAD and left coronary artery. No significant interval change in mind aneurysmal dilatation of the ascending aorta measure up to 4.3 cm. No aortic dissection. There is mild atherosclerotic calcification of the  thoracic aorta. The pulmonary arteries appear patent for the degree of opacification. Mediastinum/Nodes: No hilar or mediastinal adenopathy. The esophagus is grossly unremarkable. No mediastinal fluid collection. Lungs/Pleura: No focal consolidation, pleural effusion or pneumothorax. The central airways are patent. There is a 5 mm right middle lobe nodule (255/302) similar to prior CT. Upper Abdomen: No acute abnormality. Musculoskeletal: Degenerative changes of the spine. No acute osseous pathology. Review of the MIP images confirms the above findings. IMPRESSION: 1. No acute intrathoracic pathology. 2. Stable aneurysmal dilatation of the ascending aorta measure up to 4.3 cm. Recommend annual imaging followup by CTA or MRA. This recommendation follows 2010 ACCF/AHA/AATS/ACR/ASA/SCA/SCAI/SIR/STS/SVM Guidelines for the Diagnosis and Management of Patients with Thoracic Aortic Disease. Circulation. 2010; 121: Z733-z630. Aortic aneurysm NOS (ICD10-I71.9) 3. Stable 5 mm right middle lobe nodule. No follow-up needed if patient is low-risk.This recommendation follows the consensus statement: Guidelines for Management of Incidental Pulmonary Nodules Detected on CT Images: From the Fleischner Society 2017; Radiology 2017; 284:228-243. 4.  Aortic Atherosclerosis (ICD10-I70.0). Electronically Signed   By: Vanetta Chou M.D.   On: 01/03/2024 18:10    Assessment & Plan:   Problem List Items Addressed This Visit      Coronary artery disease   On Pitavastatin , ASA F/u w/Cardiology - F/u w/Dr Patwardhan ECHO, CTA pending Dr Patwardan d/c'd Pitavastatin , started on Lipitor , Zetia       Relevant Medications   olmesartan  (BENICAR ) 40 MG tablet   Dyslipidemia   On Atorvastatin  and Zeta      Headache   Treat HTN Tylenol  prh Head CT if not better      HTN (hypertension) - Primary   SBP 170-190 x1.5 wks. C/o HAs x1.5 wks. HA is 5/10 - dull, occipital HA Takes Losartan  50 every day (not bid) - he forgets the night dose...  D/c Losartan , start Benicar  40 mg qd      Relevant Medications   olmesartan  (BENICAR ) 40 MG tablet      Meds ordered this encounter  Medications   olmesartan  (BENICAR ) 40 MG tablet    Sig: Take 1 tablet (40 mg total) by mouth daily.    Dispense:  90 tablet    Refill:  3      Follow-up: Return in about 10 days (around 01/19/2024) for a follow-up visit.  Marolyn Noel, MD

## 2024-01-09 NOTE — Patient Instructions (Signed)
   Stop Losartan , start Benicar  (Olmesartan ) 40 mg daily

## 2024-01-09 NOTE — Assessment & Plan Note (Signed)
 SBP 170-190 x1.5 wks. C/o HAs x1.5 wks. HA is 5/10 - dull, occipital HA Takes Losartan  50 every day (not bid) - he forgets the night dose...  D/c Losartan , start Benicar  40 mg qd

## 2024-01-09 NOTE — Assessment & Plan Note (Signed)
 On Atorvastatin  and Zeta

## 2024-01-09 NOTE — Assessment & Plan Note (Signed)
 Treat HTN Tylenol  prh Head CT if not better

## 2024-01-09 NOTE — Progress Notes (Signed)
 Third attempt: Left message to call back for results. Letter to be mailed to patient to contact office for results.

## 2024-01-16 NOTE — Telephone Encounter (Signed)
 Pt daughter called in asking for results. She asked for a call or if someone can put in [place].

## 2024-01-17 ENCOUNTER — Ambulatory Visit: Admitting: Internal Medicine

## 2024-01-18 ENCOUNTER — Ambulatory Visit (HOSPITAL_COMMUNITY)
Admission: RE | Admit: 2024-01-18 | Discharge: 2024-01-18 | Disposition: A | Discharge status: Home / Self Care | Source: Ambulatory Visit | Attending: Cardiology | Admitting: Cardiology

## 2024-01-18 DIAGNOSIS — I7121 Aneurysm of the ascending aorta, without rupture: Secondary | ICD-10-CM | POA: Insufficient documentation

## 2024-01-18 LAB — ECHOCARDIOGRAM COMPLETE
Area-P 1/2: 2.43 cm2
MV M vel: 5.14 m/s
MV Peak grad: 105.7 mmHg
P 1/2 time: 443 ms
Radius: 0.5 cm
S' Lateral: 3.6 cm

## 2024-01-19 ENCOUNTER — Ambulatory Visit (INDEPENDENT_AMBULATORY_CARE_PROVIDER_SITE_OTHER): Admitting: Internal Medicine

## 2024-01-19 ENCOUNTER — Encounter: Payer: Self-pay | Admitting: Internal Medicine

## 2024-01-19 VITALS — BP 128/80 | HR 58 | Temp 97.9°F | Ht 73.0 in | Wt 229.0 lb

## 2024-01-19 DIAGNOSIS — R519 Headache, unspecified: Secondary | ICD-10-CM | POA: Diagnosis not present

## 2024-01-19 DIAGNOSIS — I10 Essential (primary) hypertension: Secondary | ICD-10-CM | POA: Diagnosis not present

## 2024-01-19 NOTE — Assessment & Plan Note (Signed)
 Resolved w/BP normalization

## 2024-01-19 NOTE — Assessment & Plan Note (Signed)
 Doing well on Benicar  40 mg qd

## 2024-01-19 NOTE — Progress Notes (Signed)
 Subjective:  Patient ID: Alexander Hughes, male    DOB: [birth year]  Age: 73 y.o. MRN: [id number]  CC: Medical Management of Chronic Issues (10 day f/u)   HPI Alexander Hughes presents for HTN, HA - doing well BP at home nl 126/65  Outpatient Medications Prior to Visit  Medication Sig Dispense Refill   aspirin  EC 81 MG tablet Take 81 mg by mouth daily. Swallow whole.     atorvastatin  (LIPITOR ) 80 MG tablet Take 1 tablet (80 mg total) by mouth daily. 90 tablet 3   Cholecalciferol (DIALYVITE VITAMIN D  5000) 125 MCG (5000 UT) capsule Take 5,000 Units by mouth daily.     ezetimibe  (ZETIA ) 10 MG tablet Take 1 tablet (10 mg total) by mouth daily. 90 tablet 3   famotidine  (PEPCID ) 40 MG tablet Take 1 tablet (40 mg total) by mouth at bedtime. 90 tablet 3   metoprolol  succinate (TOPROL -XL) 50 MG 24 hr tablet TAKE ONE TABLET BY MOUTH ONE TIME DAILY WITH OR IMMEDIATELY FOLLOWING A MEAL 90 tablet 3   nitroGLYCERIN  (NITROSTAT ) 0.4 MG SL tablet Place 1 tablet (0.4 mg total) under the tongue every 5 (five) minutes as needed for chest pain. 20 tablet 3   olmesartan  (BENICAR ) 40 MG tablet Take 1 tablet (40 mg total) by mouth daily. 90 tablet 3   pantoprazole  (PROTONIX ) 40 MG tablet Take 1 tablet (40 mg total) by mouth daily. Must keep appt for future refills 90 tablet 3   polyethylene glycol (MIRALAX  / GLYCOLAX ) 17 g packet Take 17 g by mouth daily as needed for mild constipation. 14 each 0   triamcinolone  cream (KENALOG ) 0.1 % APPLY ONE APPLICATION TOPICALLY THREE TIMES DAILY 80 g 0   No facility-administered medications prior to visit.    ROS: Review of Systems  Constitutional:  Negative for appetite change, fatigue and unexpected weight change.  HENT:  Negative for congestion, nosebleeds, sneezing, sore throat and trouble swallowing.   Eyes:  Negative for itching and visual disturbance.  Respiratory:  Negative for cough.   Cardiovascular:  Negative for chest pain, palpitations and leg  swelling.  Gastrointestinal:  Negative for abdominal distention, blood in stool, diarrhea and nausea.  Genitourinary:  Negative for frequency and hematuria.  Musculoskeletal:  Negative for back pain, gait problem, joint swelling and neck pain.  Skin:  Negative for rash.  Neurological:  Negative for dizziness, tremors, speech difficulty and weakness.  Psychiatric/Behavioral:  Negative for agitation, dysphoric mood and sleep disturbance. The patient is not nervous/anxious.     Objective:  BP 128/80   Pulse (!) 58   Temp 97.9 F (36.6 C) (Oral)   Ht 6' 1 (1.854 m)   Wt 229 lb (103.9 kg)   SpO2 98%   BMI 30.21 kg/m   BP Readings from Last 3 Encounters:  01/19/24 128/80  01/09/24 (!) 140/80  12/06/23 110/68    Wt Readings from Last 3 Encounters:  01/19/24 229 lb (103.9 kg)  01/09/24 230 lb (104.3 kg)  12/06/23 231 lb (104.8 kg)    Physical Exam Constitutional:      General: He is not in acute distress.    Appearance: He is well-developed.     Comments: NAD  Eyes:     Conjunctiva/sclera: Conjunctivae normal.     Pupils: Pupils are equal, round, and reactive to light.  Neck:     Thyroid : No thyromegaly.     Vascular: No JVD.  Cardiovascular:     Rate and Rhythm: Normal rate and  regular rhythm.     Heart sounds: Normal heart sounds. No murmur heard.    No friction rub. No gallop.  Pulmonary:     Effort: Pulmonary effort is normal. No respiratory distress.     Breath sounds: Normal breath sounds. No wheezing or rales.  Chest:     Chest wall: No tenderness.  Abdominal:     General: Bowel sounds are normal. There is no distension.     Palpations: Abdomen is soft. There is no mass.     Tenderness: There is no abdominal tenderness. There is no guarding or rebound.  Musculoskeletal:        General: No tenderness. Normal range of motion.     Cervical back: Normal range of motion.  Lymphadenopathy:     Cervical: No cervical adenopathy.  Skin:    General: Skin is warm  and dry.     Findings: No rash.  Neurological:     Mental Status: He is alert and oriented to person, place, and time.     Cranial Nerves: No cranial nerve deficit.     Motor: No abnormal muscle tone.     Coordination: Coordination normal.     Gait: Gait normal.     Deep Tendon Reflexes: Reflexes are normal and symmetric.  Psychiatric:        Behavior: Behavior normal.        Thought Content: Thought content normal.        Judgment: Judgment normal.     Lab Results  Component Value Date   WBC 6.5 04/01/2022   HGB 13.9 04/01/2022   HCT 41.6 04/01/2022   PLT 176.0 04/01/2022   GLUCOSE 90 12/29/2023   CHOL 136 09/06/2023   TRIG 93.0 09/06/2023   HDL 43.70 09/06/2023   LDLCALC 74 09/06/2023   ALT 25 09/06/2023   AST 22 09/06/2023   NA 141 12/29/2023   K 4.2 12/29/2023   CL 105 12/29/2023   CREATININE 1.34 (H) 12/29/2023   BUN 33 (H) 12/29/2023   CO2 20 12/29/2023   TSH 1.35 04/01/2022   PSA 3.85 04/01/2022   INR 1.0 08/25/2018   HGBA1C 6.0 10/11/2022    ECHOCARDIOGRAM COMPLETE Result Date: 01/18/2024    ECHOCARDIOGRAM REPORT   Patient Name:   Alexander Hughes Date of Exam: 01/18/2024 Medical Rec #:  [id number]         Height:       73.0 in Accession #:    [id number]        Weight:       230.0 lb Date of Birth:  [birth year]        BSA:          2.283 m Patient Age:    72 years          BP:           126/76 mmHg Patient Gender: M                 HR:           67 bpm. Exam Location:  [address] Procedure: 2D Echo, 3D Echo, Cardiac Doppler and Color Doppler (Both Spectral            and Color Flow Doppler were utilized during procedure). 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 CAD and Previous Myocardial Infarction; Risk                 Factors:Hypertension and Dyslipidemia.  Sonographer:    Waldo Guadalajara RCS Referring Phys: [id number] [facility] J PATWARDHAN  IMPRESSIONS  1. Left ventricular ejection fraction, by estimation, is 50 to 55%. Left ventricular ejection fraction by 3D volume is 55 %. The left ventricle has low normal function. The left ventricle has no regional wall motion abnormalities. Left ventricular diastolic parameters are consistent with Grade I diastolic dysfunction (impaired relaxation).  2. Right ventricular systolic function is normal. The right ventricular size is normal. There is normal pulmonary artery systolic pressure.  3. The mitral valve is normal in structure. Mild mitral valve regurgitation. No evidence of mitral stenosis.  4. The aortic valve is tricuspid. Aortic valve regurgitation is mild. Aortic valve sclerosis/calcification is present, without any evidence of aortic stenosis.  5. Aortic dilatation noted. There is mild dilatation of the aortic root, measuring 43 mm. There is mild dilatation of the aortic root, measuring 43 mm.  6. The inferior vena cava is normal in size with greater than 50% respiratory variability, suggesting right atrial pressure of 3 mmHg. FINDINGS  Left Ventricle: Left ventricular ejection fraction, by estimation, is 50 to 55%. Left ventricular ejection fraction by 3D volume is 55 %. The left ventricle has low normal function. The left ventricle has no regional wall motion abnormalities. The left ventricular internal cavity size was normal in size. There is no left ventricular hypertrophy. Left ventricular diastolic parameters are consistent with Grade I diastolic dysfunction (impaired relaxation). Right Ventricle: The right ventricular size is normal. No increase in right ventricular wall thickness. Right ventricular systolic function is normal. There is normal pulmonary artery systolic pressure. The tricuspid regurgitant velocity is 2.21 m/s, and  with an assumed right atrial pressure of 3 mmHg, the estimated right ventricular systolic pressure is 22.5 mmHg. Left Atrium: Left atrial size was normal in size. Right  Atrium: Right atrial size was normal in size. Pericardium: There is no evidence of pericardial effusion. Mitral Valve: The mitral valve is normal in structure. Mild mitral valve regurgitation. No evidence of mitral valve stenosis. Tricuspid Valve: The tricuspid valve is normal in structure. Tricuspid valve regurgitation is mild . No evidence of tricuspid stenosis. Aortic Valve: The aortic valve is tricuspid. Aortic valve regurgitation is mild. Aortic regurgitation PHT measures 443 msec. Aortic valve sclerosis/calcification is present, without any evidence of aortic stenosis. Pulmonic Valve: The pulmonic valve was normal in structure. Pulmonic valve regurgitation is trivial. No evidence of pulmonic stenosis. Aorta: Aortic dilatation noted. There is mild dilatation of the aortic root, measuring 43 mm. There is mild dilatation of the aortic root, measuring 43 mm. Venous: The inferior vena cava is normal in size with greater than 50% respiratory variability, suggesting right atrial pressure of 3 mmHg. IAS/Shunts: No atrial level shunt detected by color flow Doppler. Additional Comments: 3D was performed not requiring image post processing on an independent workstation and was normal.  LEFT VENTRICLE PLAX 2D LVIDd:         4.90 cm         Diastology LVIDs:         3.60 cm         LV e' medial:    5.22 cm/s LV PW:         1.10 cm         LV E/e' medial:  12.8 LV IVS:  1.30 cm         LV e' lateral:   15.90 cm/s LVOT diam:     2.70 cm         LV E/e' lateral: 4.2 LV SV:         157 LV SV Index:   69 LVOT Area:     5.73 cm        3D Volume EF                                LV 3D EF:    Left                                             ventricul                                             ar                                             ejection                                             fraction                                             by 3D                                             volume is                                              55 %.                                 3D Volume EF:                                3D EF:        55 %                                LV EDV:       195 ml                                LV ESV:       88 ml  LV SV:        107 ml RIGHT VENTRICLE RV Basal diam:  4.20 cm TAPSE (M-mode): 1.8 cm RVSP:           22.5 mmHg LEFT ATRIUM             Index        RIGHT ATRIUM           Index LA diam:        3.80 cm 1.66 cm/m   RA Pressure: 3.00 mmHg LA Vol (A2C):   66.4 ml 29.08 ml/m  RA Area:     14.80 cm LA Vol (A4C):   40.1 ml 17.56 ml/m  RA Volume:   31.90 ml  13.97 ml/m LA Biplane Vol: 56.9 ml 24.92 ml/m  AORTIC VALVE LVOT Vmax:   130.00 cm/s LVOT Vmean:  79.700 cm/s LVOT VTI:    0.274 m AI PHT:      443 msec  AORTA Ao Root diam: 4.30 cm Ao Asc diam:  4.30 cm MITRAL VALVE                  TRICUSPID VALVE MV Area (PHT): 2.43 cm       TR Peak grad:   19.5 mmHg MV Decel Time: 312 msec       TR Vmax:        221.00 cm/s MR Peak grad:    105.7 mmHg   Estimated RAP:  3.00 mmHg MR Mean grad:    74.0 mmHg    RVSP:           22.5 mmHg MR Vmax:         514.00 cm/s MR Vmean:        410.0 cm/s   SHUNTS MR PISA:         1.57 cm     Systemic VTI:  0.27 m MR PISA Eff ROA: 9 mm        Systemic Diam: 2.70 cm MR PISA Radius:  0.50 cm MV E velocity: 66.60 cm/s MV A velocity: 95.40 cm/s MV E/A ratio:  0.70 Morene Brownie Electronically signed by Morene Brownie Signature Date/Time: 01/18/2024/11:56:40 AM    Final     Assessment & Plan:   Problem List Items Addressed This Visit     Headache   Resolved w/BP normalization      HTN (hypertension) - Primary   Doing well on Benicar  40 mg qd         No orders of the defined types were placed in this encounter.     Follow-up: Return in about 6 months (around 07/21/2024) for a follow-up visit.  Marolyn Noel, MD

## 2024-01-20 ENCOUNTER — Other Ambulatory Visit: Payer: Self-pay | Admitting: Internal Medicine

## 2024-03-07 ENCOUNTER — Ambulatory Visit: Admitting: Internal Medicine

## 2024-03-07 ENCOUNTER — Encounter: Payer: Self-pay | Admitting: Internal Medicine

## 2024-03-07 VITALS — BP 118/80 | HR 53 | Temp 98.0°F | Ht 73.0 in | Wt 231.8 lb

## 2024-03-07 DIAGNOSIS — I10 Essential (primary) hypertension: Secondary | ICD-10-CM | POA: Diagnosis not present

## 2024-03-07 DIAGNOSIS — I2510 Atherosclerotic heart disease of native coronary artery without angina pectoris: Secondary | ICD-10-CM | POA: Diagnosis not present

## 2024-03-07 DIAGNOSIS — I2583 Coronary atherosclerosis due to lipid rich plaque: Secondary | ICD-10-CM | POA: Diagnosis not present

## 2024-03-07 DIAGNOSIS — R42 Dizziness and giddiness: Secondary | ICD-10-CM | POA: Diagnosis not present

## 2024-03-07 DIAGNOSIS — E785 Hyperlipidemia, unspecified: Secondary | ICD-10-CM

## 2024-03-07 NOTE — Assessment & Plan Note (Signed)
 Doing well on Benicar  40 mg qd

## 2024-03-07 NOTE — Assessment & Plan Note (Signed)
 Benign Positional Vertigo symptoms on the left -- resolved

## 2024-03-07 NOTE — Progress Notes (Signed)
 Subjective:  Patient ID: Alexander Hughes, male    DOB: February 12, 1951  Age: 73 y.o. MRN: [id number]  CC: Medical Management of Chronic Issues   HPI Alexander Hughes presents for HTN, dizziness, CAD Doing much better  Outpatient Medications Prior to Visit  Medication Sig Dispense Refill   aspirin  EC 81 MG tablet Take 81 mg by mouth daily. Swallow whole.     atorvastatin  (LIPITOR ) 80 MG tablet Take 1 tablet (80 mg total) by mouth daily. 90 tablet 3   Cholecalciferol (DIALYVITE VITAMIN D  5000) 125 MCG (5000 UT) capsule Take 5,000 Units by mouth daily.     ezetimibe  (ZETIA ) 10 MG tablet Take 1 tablet (10 mg total) by mouth daily. 90 tablet 3   famotidine  (PEPCID ) 40 MG tablet Take 1 tablet (40 mg total) by mouth at bedtime. 90 tablet 3   metoprolol  succinate (TOPROL -XL) 50 MG 24 hr tablet TAKE ONE TABLET BY MOUTH ONCE A DAY WITH OR IMMEDIATELY FOLLOWING A MEAL 90 tablet 0   nitroGLYCERIN  (NITROSTAT ) 0.4 MG SL tablet Place 1 tablet (0.4 mg total) under the tongue every 5 (five) minutes as needed for chest pain. 20 tablet 3   olmesartan  (BENICAR ) 40 MG tablet Take 1 tablet (40 mg total) by mouth daily. 90 tablet 3   pantoprazole  (PROTONIX ) 40 MG tablet TAKE ONE TABLET BY MOUTH ONCE A DAY 90 tablet 0   polyethylene glycol (MIRALAX  / GLYCOLAX ) 17 g packet Take 17 g by mouth daily as needed for mild constipation. 14 each 0   triamcinolone  cream (KENALOG ) 0.1 % APPLY ONE APPLICATION TOPICALLY THREE TIMES DAILY 80 g 0   No facility-administered medications prior to visit.    ROS: Review of Systems  Constitutional:  Negative for appetite change, fatigue and unexpected weight change.  HENT:  Negative for congestion, nosebleeds, sneezing, sore throat and trouble swallowing.   Eyes:  Negative for itching and visual disturbance.  Respiratory:  Negative for cough.   Cardiovascular:  Negative for chest pain, palpitations and leg swelling.  Gastrointestinal:  Negative for abdominal distention, blood  in stool, diarrhea and nausea.  Genitourinary:  Negative for frequency and hematuria.  Musculoskeletal:  Positive for arthralgias. Negative for back pain, gait problem, joint swelling and neck pain.  Skin:  Negative for rash.  Neurological:  Negative for dizziness, tremors, speech difficulty and weakness.  Psychiatric/Behavioral:  Negative for agitation, dysphoric mood, sleep disturbance and suicidal ideas. The patient is not nervous/anxious.     Objective:  BP 118/80   Pulse (!) 53   Temp 98 F (36.7 C)   Ht 6' 1 (1.854 m)   Wt 231 lb 12.8 oz (105.1 kg)   SpO2 97%   BMI 30.58 kg/m   BP Readings from Last 3 Encounters:  03/07/24 118/80  01/19/24 128/80  01/09/24 (!) 140/80    Wt Readings from Last 3 Encounters:  03/07/24 231 lb 12.8 oz (105.1 kg)  01/19/24 229 lb (103.9 kg)  01/09/24 230 lb (104.3 kg)    Physical Exam Constitutional:      General: He is not in acute distress.    Appearance: He is well-developed. He is obese.     Comments: NAD  Eyes:     Conjunctiva/sclera: Conjunctivae normal.     Pupils: Pupils are equal, round, and reactive to light.  Neck:     Thyroid : No thyromegaly.     Vascular: No JVD.  Cardiovascular:     Rate and Rhythm: Normal rate and regular rhythm.  Heart sounds: Normal heart sounds. No murmur heard.    No friction rub. No gallop.  Pulmonary:     Effort: Pulmonary effort is normal. No respiratory distress.     Breath sounds: Normal breath sounds. No wheezing or rales.  Chest:     Chest wall: No tenderness.  Abdominal:     General: Bowel sounds are normal. There is no distension.     Palpations: Abdomen is soft. There is no mass.     Tenderness: There is no abdominal tenderness. There is no guarding or rebound.  Musculoskeletal:        General: No tenderness. Normal range of motion.     Cervical back: Normal range of motion.     Right lower leg: No edema.     Left lower leg: No edema.  Lymphadenopathy:     Cervical: No  cervical adenopathy.  Skin:    General: Skin is warm and dry.     Findings: No rash.  Neurological:     Mental Status: He is alert and oriented to person, place, and time.     Cranial Nerves: No cranial nerve deficit.     Motor: No abnormal muscle tone.     Coordination: Coordination normal.     Gait: Gait normal.     Deep Tendon Reflexes: Reflexes are normal and symmetric.  Psychiatric:        Behavior: Behavior normal.        Thought Content: Thought content normal.        Judgment: Judgment normal.     Lab Results  Component Value Date   WBC 6.5 04/01/2022   HGB 13.9 04/01/2022   HCT 41.6 04/01/2022   PLT 176.0 04/01/2022   GLUCOSE 90 12/29/2023   CHOL 136 09/06/2023   TRIG 93.0 09/06/2023   HDL 43.70 09/06/2023   LDLCALC 74 09/06/2023   ALT 25 09/06/2023   AST 22 09/06/2023   NA 141 12/29/2023   K 4.2 12/29/2023   CL 105 12/29/2023   CREATININE 1.34 (H) 12/29/2023   BUN 33 (H) 12/29/2023   CO2 20 12/29/2023   TSH 1.35 04/01/2022   PSA 3.85 04/01/2022   INR 1.0 08/25/2018   HGBA1C 6.0 10/11/2022    ECHOCARDIOGRAM COMPLETE Result Date: 01/18/2024    ECHOCARDIOGRAM REPORT   Patient Name:   Alexander Hughes Date of Exam: 01/18/2024 Medical Rec #:  [id number]         Height:       73.0 in Accession #:    [id number]        Weight:       230.0 lb Date of Birth:  Sep 04, 1950        BSA:          2.283 m Patient Age:    72 years          BP:           126/76 mmHg Patient Gender: M                 HR:           67 bpm. Exam Location:  [address] Procedure: 2D Echo, 3D Echo, Cardiac Doppler and Color Doppler (Both Spectral            and Color Flow Doppler were utilized during procedure). 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 CAD and Previous Myocardial Infarction; Risk                 Factors:Hypertension and Dyslipidemia.  Sonographer:    Waldo Guadalajara RCS Referring Phys: [id number] [facility] J PATWARDHAN IMPRESSIONS  1. Left ventricular ejection fraction, by estimation, is 50 to 55%. Left ventricular ejection fraction by 3D volume is 55 %. The left ventricle has low normal function. The left ventricle has no regional wall motion abnormalities. Left ventricular diastolic parameters are consistent with Grade I diastolic dysfunction (impaired relaxation).  2. Right ventricular systolic function is normal. The right ventricular size is normal. There is normal pulmonary artery systolic pressure.  3. The mitral valve is normal in structure. Mild mitral valve regurgitation. No evidence of mitral stenosis.  4. The aortic valve is tricuspid. Aortic valve regurgitation is mild. Aortic valve sclerosis/calcification is present, without any evidence of aortic stenosis.  5. Aortic dilatation noted. There is mild dilatation of the aortic root, measuring 43 mm. There is mild dilatation of the aortic root, measuring 43 mm.  6. The inferior vena cava is normal in size with greater than 50% respiratory variability, suggesting right atrial pressure of 3 mmHg. FINDINGS  Left Ventricle: Left ventricular ejection fraction, by estimation, is 50 to 55%. Left ventricular ejection fraction by 3D volume is 55 %. The left ventricle has low normal function. The left ventricle has no regional wall motion abnormalities. The left ventricular internal cavity size was normal in size. There is no left ventricular hypertrophy. Left ventricular diastolic parameters are consistent with Grade I diastolic dysfunction (impaired relaxation). Right Ventricle: The right ventricular size is normal. No increase in right ventricular wall thickness. Right ventricular systolic function is normal. There is normal pulmonary artery systolic pressure. The tricuspid regurgitant velocity is 2.21 m/s, and  with an assumed right atrial pressure of 3 mmHg, the estimated right ventricular systolic pressure is 22.5 mmHg.  Left Atrium: Left atrial size was normal in size. Right Atrium: Right atrial size was normal in size. Pericardium: There is no evidence of pericardial effusion. Mitral Valve: The mitral valve is normal in structure. Mild mitral valve regurgitation. No evidence of mitral valve stenosis. Tricuspid Valve: The tricuspid valve is normal in structure. Tricuspid valve regurgitation is mild . No evidence of tricuspid stenosis. Aortic Valve: The aortic valve is tricuspid. Aortic valve regurgitation is mild. Aortic regurgitation PHT measures 443 msec. Aortic valve sclerosis/calcification is present, without any evidence of aortic stenosis. Pulmonic Valve: The pulmonic valve was normal in structure. Pulmonic valve regurgitation is trivial. No evidence of pulmonic stenosis. Aorta: Aortic dilatation noted. There is mild dilatation of the aortic root, measuring 43 mm. There is mild dilatation of the aortic root, measuring 43 mm. Venous: The inferior vena cava is normal in size with greater than 50% respiratory variability, suggesting right atrial pressure of 3 mmHg. IAS/Shunts: No atrial level shunt detected by color flow Doppler. Additional Comments: 3D was performed not requiring image post processing on an independent workstation and was normal.  LEFT VENTRICLE PLAX 2D LVIDd:         4.90 cm         Diastology LVIDs:         3.60 cm         LV e' medial:    5.22 cm/s LV PW:         1.10 cm         LV E/e' medial:  12.8 LV IVS:  1.30 cm         LV e' lateral:   15.90 cm/s LVOT diam:     2.70 cm         LV E/e' lateral: 4.2 LV SV:         157 LV SV Index:   69 LVOT Area:     5.73 cm        3D Volume EF                                LV 3D EF:    Left                                             ventricul                                             ar                                             ejection                                             fraction                                             by 3D                                              volume is                                             55 %.                                 3D Volume EF:                                3D EF:        55 %                                LV EDV:       195 ml                                LV ESV:       88 ml  LV SV:        107 ml RIGHT VENTRICLE RV Basal diam:  4.20 cm TAPSE (M-mode): 1.8 cm RVSP:           22.5 mmHg LEFT ATRIUM             Index        RIGHT ATRIUM           Index LA diam:        3.80 cm 1.66 cm/m   RA Pressure: 3.00 mmHg LA Vol (A2C):   66.4 ml 29.08 ml/m  RA Area:     14.80 cm LA Vol (A4C):   40.1 ml 17.56 ml/m  RA Volume:   31.90 ml  13.97 ml/m LA Biplane Vol: 56.9 ml 24.92 ml/m  AORTIC VALVE LVOT Vmax:   130.00 cm/s LVOT Vmean:  79.700 cm/s LVOT VTI:    0.274 m AI PHT:      443 msec  AORTA Ao Root diam: 4.30 cm Ao Asc diam:  4.30 cm MITRAL VALVE                  TRICUSPID VALVE MV Area (PHT): 2.43 cm       TR Peak grad:   19.5 mmHg MV Decel Time: 312 msec       TR Vmax:        221.00 cm/s MR Peak grad:    105.7 mmHg   Estimated RAP:  3.00 mmHg MR Mean grad:    74.0 mmHg    RVSP:           22.5 mmHg MR Vmax:         514.00 cm/s MR Vmean:        410.0 cm/s   SHUNTS MR PISA:         1.57 cm     Systemic VTI:  0.27 m MR PISA Eff ROA: 9 mm        Systemic Diam: 2.70 cm MR PISA Radius:  0.50 cm MV E velocity: 66.60 cm/s MV A velocity: 95.40 cm/s MV E/A ratio:  0.70 Morene Brownie Electronically signed by Morene Brownie Signature Date/Time: 01/18/2024/11:56:40 AM    Final     Assessment & Plan:   Problem List Items Addressed This Visit     Coronary artery disease   On Pitavastatin , ASA F/u w/Cardiology - F/u w/Dr [place] ECHO, CTA pending Dr Ritchie d/c'd Pitavastatin , started on Lipitor , Zetia       Dyslipidemia   On Atorvastatin  and Zeta      HTN (hypertension) - Primary   Doing well on Benicar  40 mg qd      Vertigo   Benign Positional Vertigo symptoms on the  left -- resolved          No orders of the defined types were placed in this encounter.     Follow-up: Return in about 4 months (around 07/08/2024) for a follow-up visit.  Marolyn Noel, MD

## 2024-03-07 NOTE — Assessment & Plan Note (Signed)
 On Atorvastatin  and Zeta

## 2024-03-07 NOTE — Assessment & Plan Note (Signed)
 On Pitavastatin , ASA F/u w/Cardiology - F/u w/Dr Patwardhan ECHO, CTA pending Dr Patwardan d/c'd Pitavastatin , started on Lipitor , Zetia

## 2024-04-15 ENCOUNTER — Other Ambulatory Visit: Payer: Self-pay | Admitting: Internal Medicine

## 2024-06-14 ENCOUNTER — Ambulatory Visit: Admitting: Internal Medicine

## 2024-06-14 ENCOUNTER — Encounter: Payer: Self-pay | Admitting: Internal Medicine

## 2024-06-14 VITALS — BP 130/92 | HR 51 | Temp 97.6°F | Ht 73.0 in | Wt 237.0 lb

## 2024-06-14 DIAGNOSIS — E782 Mixed hyperlipidemia: Secondary | ICD-10-CM

## 2024-06-14 DIAGNOSIS — E785 Hyperlipidemia, unspecified: Secondary | ICD-10-CM | POA: Diagnosis not present

## 2024-06-14 DIAGNOSIS — I2583 Coronary atherosclerosis due to lipid rich plaque: Secondary | ICD-10-CM | POA: Diagnosis not present

## 2024-06-14 DIAGNOSIS — I10 Essential (primary) hypertension: Secondary | ICD-10-CM

## 2024-06-14 DIAGNOSIS — R972 Elevated prostate specific antigen [PSA]: Secondary | ICD-10-CM

## 2024-06-14 DIAGNOSIS — I2510 Atherosclerotic heart disease of native coronary artery without angina pectoris: Secondary | ICD-10-CM | POA: Diagnosis not present

## 2024-06-14 DIAGNOSIS — R350 Frequency of micturition: Secondary | ICD-10-CM

## 2024-06-14 DIAGNOSIS — N401 Enlarged prostate with lower urinary tract symptoms: Secondary | ICD-10-CM

## 2024-06-14 LAB — COMPREHENSIVE METABOLIC PANEL WITH GFR
ALT: 29 U/L (ref 3–53)
AST: 23 U/L (ref 5–37)
Albumin: 4.4 g/dL (ref 3.5–5.2)
Alkaline Phosphatase: 98 U/L (ref 39–117)
BUN: 27 mg/dL — ABNORMAL HIGH (ref 6–23)
CO2: 26 meq/L (ref 19–32)
Calcium: 9.3 mg/dL (ref 8.4–10.5)
Chloride: 105 meq/L (ref 96–112)
Creatinine, Ser: 1.09 mg/dL (ref 0.40–1.50)
GFR: 66.56 mL/min
Glucose, Bld: 89 mg/dL (ref 70–99)
Potassium: 4.5 meq/L (ref 3.5–5.1)
Sodium: 138 meq/L (ref 135–145)
Total Bilirubin: 1 mg/dL (ref 0.2–1.2)
Total Protein: 7.1 g/dL (ref 6.0–8.3)

## 2024-06-14 LAB — PSA: PSA: 6.47 ng/mL — ABNORMAL HIGH (ref 0.10–4.00)

## 2024-06-14 LAB — TSH: TSH: 0.85 u[IU]/mL (ref 0.35–5.50)

## 2024-06-14 NOTE — Assessment & Plan Note (Signed)
 On Atorvastatin  and Zeta

## 2024-06-14 NOTE — Assessment & Plan Note (Addendum)
 HAs, LBP- resolved - no relapse

## 2024-06-14 NOTE — Assessment & Plan Note (Signed)
 On Pitavastatin , ASA F/u w/Cardiology - F/u w/Dr Patwardhan ECHO, CTA pending Dr Patwardan d/c'd Pitavastatin , started on Lipitor , Zetia

## 2024-06-14 NOTE — Assessment & Plan Note (Signed)
 Dr Elmira d/c'd Pitavastatin , started on Lipitor /Zetia 11/2023
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
## 2024-06-14 NOTE — Assessment & Plan Note (Signed)
 Doing well on Benicar  40 mg qd

## 2024-06-14 NOTE — Assessment & Plan Note (Signed)
 Pt was on FLomax in the past.

## 2024-06-24 ENCOUNTER — Ambulatory Visit: Payer: Self-pay | Admitting: Internal Medicine

## 2024-06-24 DIAGNOSIS — R972 Elevated prostate specific antigen [PSA]: Secondary | ICD-10-CM | POA: Insufficient documentation

## 2024-06-24 NOTE — Addendum Note (Signed)
 Addended by: Demone Lyles V on: 06/24/2024 08:46 PM   Modules accepted: Orders, Level of Service

## 2024-06-24 NOTE — Assessment & Plan Note (Signed)
 New. Repeat PSA/free PSA in April

## 2024-09-13 ENCOUNTER — Ambulatory Visit: Admitting: Internal Medicine

## 2024-12-03 ENCOUNTER — Other Ambulatory Visit (HOSPITAL_COMMUNITY)
